# Patient Record
Sex: Male | Born: 1955
Health system: Southern US, Community
[De-identification: ages and names within clinical notes are randomized; demographics above are authoritative.]

## PROBLEM LIST (undated history)

## (undated) DIAGNOSIS — R05 Cough: Secondary | ICD-10-CM

## (undated) DIAGNOSIS — M545 Low back pain, unspecified: Secondary | ICD-10-CM

## (undated) DIAGNOSIS — R058 Other specified cough: Secondary | ICD-10-CM

## (undated) DIAGNOSIS — M109 Gout, unspecified: Secondary | ICD-10-CM

## (undated) DIAGNOSIS — I10 Essential (primary) hypertension: Secondary | ICD-10-CM

## (undated) DIAGNOSIS — T464X5A Adverse effect of angiotensin-converting-enzyme inhibitors, initial encounter: Secondary | ICD-10-CM

## (undated) DIAGNOSIS — S8290XA Unspecified fracture of unspecified lower leg, initial encounter for closed fracture: Secondary | ICD-10-CM

## (undated) HISTORY — DX: Adverse effect of angiotensin-converting-enzyme inhibitors, initial encounter: T46.4X5A

## (undated) HISTORY — PX: LEG SURGERY: SHX1003

## (undated) HISTORY — DX: Low back pain, unspecified: M54.50

## (undated) HISTORY — DX: Low back pain: M54.5

## (undated) HISTORY — DX: Gout, unspecified: M10.9

## (undated) HISTORY — DX: Cough: R05

## (undated) HISTORY — DX: Unspecified fracture of unspecified lower leg, initial encounter for closed fracture: S82.90XA

## (undated) HISTORY — DX: Other specified cough: R05.8

## (undated) HISTORY — DX: Essential (primary) hypertension: I10

---

## 1986-10-08 DIAGNOSIS — S8290XA Unspecified fracture of unspecified lower leg, initial encounter for closed fracture: Secondary | ICD-10-CM

## 1986-10-08 HISTORY — DX: Unspecified fracture of unspecified lower leg, initial encounter for closed fracture: S82.90XA

## 2002-03-17 ENCOUNTER — Encounter: Payer: Self-pay | Admitting: Emergency Medicine

## 2002-03-17 ENCOUNTER — Emergency Department (HOSPITAL_COMMUNITY): Admission: EM | Admit: 2002-03-17 | Discharge: 2002-03-17 | Payer: Self-pay | Admitting: Emergency Medicine

## 2005-02-21 ENCOUNTER — Emergency Department (HOSPITAL_COMMUNITY): Admission: EM | Admit: 2005-02-21 | Discharge: 2005-02-21 | Payer: Self-pay | Admitting: Family Medicine

## 2005-03-07 ENCOUNTER — Emergency Department (HOSPITAL_COMMUNITY): Admission: EM | Admit: 2005-03-07 | Discharge: 2005-03-07 | Payer: Self-pay | Admitting: Family Medicine

## 2006-01-10 ENCOUNTER — Ambulatory Visit: Payer: Self-pay | Admitting: Internal Medicine

## 2006-01-16 ENCOUNTER — Ambulatory Visit (HOSPITAL_COMMUNITY): Admission: RE | Admit: 2006-01-16 | Discharge: 2006-01-16 | Payer: Self-pay | Admitting: Internal Medicine

## 2006-01-16 ENCOUNTER — Ambulatory Visit: Payer: Self-pay | Admitting: Internal Medicine

## 2006-02-06 ENCOUNTER — Ambulatory Visit: Payer: Self-pay | Admitting: Internal Medicine

## 2007-02-04 ENCOUNTER — Ambulatory Visit: Payer: Self-pay | Admitting: Internal Medicine

## 2007-02-04 LAB — CONVERTED CEMR LAB
ALT: 20 units/L (ref 0–40)
AST: 22 units/L (ref 0–37)
Albumin: 4 g/dL (ref 3.5–5.2)
Alkaline Phosphatase: 64 units/L (ref 39–117)
BUN: 8 mg/dL (ref 6–23)
Bilirubin, Direct: 0.2 mg/dL (ref 0.0–0.3)
CO2: 28 meq/L (ref 19–32)
Calcium: 9.3 mg/dL (ref 8.4–10.5)
Chloride: 106 meq/L (ref 96–112)
Cholesterol: 131 mg/dL (ref 0–200)
Creatinine, Ser: 1.1 mg/dL (ref 0.4–1.5)
GFR calc Af Amer: 91 mL/min
GFR calc non Af Amer: 75 mL/min
Glucose, Bld: 82 mg/dL (ref 70–99)
HDL: 34.2 mg/dL — ABNORMAL LOW (ref >39.0)
LDL Cholesterol: 67 mg/dL (ref 0–99)
PSA: 0.25 ng/mL (ref 0.10–4.00)
Potassium: 4 meq/L (ref 3.5–5.1)
Sodium: 140 meq/L (ref 135–145)
Testosterone: 390.36 ng/dL (ref 350.00–890)
Total Bilirubin: 0.9 mg/dL (ref 0.3–1.2)
Total CHOL/HDL Ratio: 3.8
Total Protein: 7.4 g/dL (ref 6.0–8.3)
Triglycerides: 149 mg/dL (ref 0–149)
VLDL: 30 mg/dL (ref 0–40)

## 2007-02-07 ENCOUNTER — Ambulatory Visit: Payer: Self-pay | Admitting: Internal Medicine

## 2007-03-14 ENCOUNTER — Ambulatory Visit: Payer: Self-pay | Admitting: Internal Medicine

## 2007-04-18 ENCOUNTER — Ambulatory Visit: Payer: Self-pay | Admitting: Internal Medicine

## 2007-04-25 ENCOUNTER — Ambulatory Visit: Payer: Self-pay | Admitting: Internal Medicine

## 2007-04-25 LAB — CONVERTED CEMR LAB
BUN: 8 mg/dL (ref 6–23)
CO2: 32 meq/L (ref 19–32)
Calcium: 9.5 mg/dL (ref 8.4–10.5)
Chloride: 105 meq/L (ref 96–112)
Creatinine, Ser: 1.1 mg/dL (ref 0.4–1.5)
GFR calc Af Amer: 91 mL/min
GFR calc non Af Amer: 75 mL/min
Glucose, Bld: 94 mg/dL (ref 70–99)
Potassium: 4.5 meq/L (ref 3.5–5.1)
Sodium: 143 meq/L (ref 135–145)

## 2007-05-02 ENCOUNTER — Ambulatory Visit: Payer: Self-pay | Admitting: Internal Medicine

## 2007-11-07 ENCOUNTER — Ambulatory Visit: Payer: Self-pay | Admitting: Internal Medicine

## 2007-11-07 DIAGNOSIS — J312 Chronic pharyngitis: Secondary | ICD-10-CM | POA: Insufficient documentation

## 2007-11-07 DIAGNOSIS — M545 Low back pain, unspecified: Secondary | ICD-10-CM | POA: Insufficient documentation

## 2007-11-07 DIAGNOSIS — E669 Obesity, unspecified: Secondary | ICD-10-CM | POA: Insufficient documentation

## 2007-11-07 DIAGNOSIS — E785 Hyperlipidemia, unspecified: Secondary | ICD-10-CM | POA: Insufficient documentation

## 2007-11-07 DIAGNOSIS — I10 Essential (primary) hypertension: Secondary | ICD-10-CM | POA: Insufficient documentation

## 2007-11-07 DIAGNOSIS — F528 Other sexual dysfunction not due to a substance or known physiological condition: Secondary | ICD-10-CM | POA: Insufficient documentation

## 2008-04-05 ENCOUNTER — Ambulatory Visit: Payer: Self-pay | Admitting: Internal Medicine

## 2008-04-05 DIAGNOSIS — M542 Cervicalgia: Secondary | ICD-10-CM | POA: Insufficient documentation

## 2008-07-14 ENCOUNTER — Ambulatory Visit: Payer: Self-pay | Admitting: Internal Medicine

## 2008-07-14 LAB — CONVERTED CEMR LAB
ALT: 18 units/L (ref 0–53)
AST: 22 units/L (ref 0–37)
CRP, High Sensitivity: <1 — ABNORMAL LOW (ref 0.00–5.00)
Cholesterol: 125 mg/dL (ref 0–200)
HDL: 33.5 mg/dL — ABNORMAL LOW (ref >39.0)
LDL Cholesterol: 64 mg/dL (ref 0–99)
PSA: 0.26 ng/mL (ref 0.10–4.00)
TSH: 1.04 microintl units/mL (ref 0.35–5.50)
Total CHOL/HDL Ratio: 3.7
Triglycerides: 139 mg/dL (ref 0–149)
VLDL: 28 mg/dL (ref 0–40)

## 2008-08-23 ENCOUNTER — Ambulatory Visit: Payer: Self-pay | Admitting: Internal Medicine

## 2008-10-25 ENCOUNTER — Telehealth: Payer: Self-pay | Admitting: Internal Medicine

## 2009-04-04 ENCOUNTER — Telehealth: Payer: Self-pay | Admitting: Internal Medicine

## 2009-06-14 ENCOUNTER — Ambulatory Visit: Payer: Self-pay | Admitting: Internal Medicine

## 2009-06-14 DIAGNOSIS — K644 Residual hemorrhoidal skin tags: Secondary | ICD-10-CM | POA: Insufficient documentation

## 2009-06-23 ENCOUNTER — Telehealth: Payer: Self-pay | Admitting: Internal Medicine

## 2010-01-18 ENCOUNTER — Telehealth: Payer: Self-pay | Admitting: Internal Medicine

## 2010-01-19 ENCOUNTER — Telehealth: Payer: Self-pay | Admitting: Internal Medicine

## 2010-05-10 ENCOUNTER — Telehealth: Payer: Self-pay | Admitting: Internal Medicine

## 2010-06-02 ENCOUNTER — Ambulatory Visit: Payer: Self-pay | Admitting: Internal Medicine

## 2010-06-05 ENCOUNTER — Encounter: Payer: Self-pay | Admitting: Internal Medicine

## 2010-07-07 ENCOUNTER — Encounter: Payer: Self-pay | Admitting: Internal Medicine

## 2010-07-07 LAB — CONVERTED CEMR LAB
ALT: 19 units/L (ref 0–53)
AST: 16 units/L (ref 0–37)
Albumin: 4.4 g/dL (ref 3.5–5.2)
Alkaline Phosphatase: 76 units/L (ref 39–117)
BUN: 10 mg/dL (ref 6–23)
Bilirubin, Direct: 0.1 mg/dL (ref 0.0–0.3)
CO2: 26 meq/L (ref 19–32)
Calcium: 9.4 mg/dL (ref 8.4–10.5)
Chloride: 105 meq/L (ref 96–112)
Cholesterol: 132 mg/dL (ref 0–200)
Creatinine, Ser: 1.17 mg/dL (ref 0.40–1.50)
Glucose, Bld: 77 mg/dL (ref 70–99)
HDL: 37 mg/dL — ABNORMAL LOW (ref >39)
Indirect Bilirubin: 0.4 mg/dL (ref 0.0–0.9)
LDL Cholesterol: 57 mg/dL (ref 0–99)
Potassium: 4.7 meq/L (ref 3.5–5.3)
Sodium: 141 meq/L (ref 135–145)
TSH: 1.534 microintl units/mL (ref 0.350–4.500)
Total Bilirubin: 0.5 mg/dL (ref 0.3–1.2)
Total CHOL/HDL Ratio: 3.6
Total Protein: 7.3 g/dL (ref 6.0–8.3)
Triglycerides: 192 mg/dL — ABNORMAL HIGH (ref <150)
VLDL: 38 mg/dL (ref 0–40)

## 2010-08-03 ENCOUNTER — Telehealth: Payer: Self-pay | Admitting: Internal Medicine

## 2010-08-03 ENCOUNTER — Encounter: Payer: Self-pay | Admitting: Internal Medicine

## 2010-11-07 NOTE — Progress Notes (Signed)
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 Initial call taken by: Lannette Donath,  May 10, 2010 1:03 PM  Follow-up for Phone Call        ok to schedule appt Follow-up by: D. Thomos Lemons DO,  May 15, 2010 9:43 AM  Additional Follow-up for Phone Call Additional follow up Details #1::        Pt has been scheduled for 05/24/10 Additional Follow-up by: Lannette Donath,  May 15, 2010 11:31 AM

## 2010-11-07 NOTE — Assessment & Plan Note (Signed)
Summary: cpx/mhf   Vital Signs:  Patient profile:   55 year old male Height:      72 inches Weight:      226.50 pounds BMI:     30.83 O2 Sat:      99 % on Room air Temp:     97.8 degrees F oral Pulse rate:   68 / minute Pulse rhythm:   regular Resp:     18 per minute BP sitting:   140 / 80  (left arm) Cuff size:   large  Vitals Entered By: Glendell Docker CMA (June 02, 2010 11:26 AM)  O2 Flow:  Room air CC: CPX Is Patient Diabetic? No Pain Assessment Patient in pain? no      Comments physical for foster care, non fasting    Primary Care Provider:  Dondra Spry DO  CC:  CPX.  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 Ace Inhibitors  Past History:  Past Medical History: Right leg fracture 1988 Hypertension ACE cough   Low Back Pain    Family History: Mother has hypertension      Social History: Occupation:  Naval architect  Museum/gallery conservator) Married  3 grown children Never Smoked Alcohol use-no Drug use-no     Smoking Status:  quit Caffeine use/day:  NONE Does Patient Exercise:  yes  Review of Systems  The patient denies anorexia, fever, weight loss, weight gain, chest pain, syncope, dyspnea on exertion, prolonged cough, abdominal pain, melena, hematochezia, severe indigestion/heartburn, and depression.    Physical Exam  General:  alert, well-developed, and well-nourished.   Lungs:  normal respiratory effort and normal breath sounds.   Heart:  normal rate, regular rhythm, and no gallop.     Abdomen:  soft, non-tender, normal bowel sounds, no masses, no rigidity, no hepatomegaly, and no splenomegaly.   Extremities:  No lower extremity edema  Neurologic:  cranial nerves II-XII intact and gait normal.   Psych:  normally interactive, good eye contact, not anxious appearing, and not depressed appearing.     Impression & Recommendations:  Problem # 1:  PREVENTIVE HEALTH CARE (ICD-V70.0) Reviewed adult health maintenance protocols. Pt counseled on diet and exercise. no medical issues that would prevent pt from acting as foster parent  Colonoscopy: Normal (05/02/2007) Td Booster: Tdap (06/02/2010)   Flu Vax: given (08/25/2007)   Chol: 125 (07/14/2008)   HDL: 33.5 (07/14/2008)   LDL: 64 (07/14/2008)   TG: 139 (07/14/2008) TSH: 1.04 (07/14/2008)   PSA: 0.26 (07/14/2008)  Problem # 2:  HYPERTENSION (ICD-401.9) azor changed to generic regimen.  reassess in 2 months His updated medication list for this problem includes:    Amlodipine Besylate 10 Mg Tabs (Amlodipine besylate) ..... One by mouth once daily    Losartan Potassium 50 Mg Tabs (Losartan potassium) ..... One by mouth once daily  Future Orders: T-Basic Metabolic Panel [phone]) ... 06/14/2010 T-Hepatic Function [phone]) ... 06/14/2010 T-Lipid Profile [id number]) ... 06/14/2010 T-TSH [phone]) .Marland KitchenMarland Kitchen  06/14/2010  BP today: 140/80 Prior BP: 130/80 (06/14/2009)  Labs Reviewed: K+: 4.5 (04/25/2007) Creat: : 1.1 (04/25/2007)   Chol: 125 (07/14/2008)   HDL: 33.5 (07/14/2008)   LDL: 64 (07/14/2008)   TG: 139 (07/14/2008)  Complete Medication List: 1)  Amlodipine Besylate 10 Mg Tabs (Amlodipine besylate) .... One by mouth once daily 2)  Losartan Potassium 50 Mg Tabs (Losartan potassium) .... One by mouth once daily  Other Orders: TB Skin Test [id number]) Admin 1st Vaccine (00938) Tdap => 72yrs IM (18299) Admin of Any Addtl Vaccine (37169) EKG w/ Interpretation (93000)  Patient Instructions: 1)  Please  schedule a follow-up appointment in 2 months. 2)  BMP prior to visit, ICD-9:  401.9 3)  Hepatic Panel prior to visit, ICD-9: 401.9 4)  Lipid Panel prior to visit, ICD-9:  401.9 5)  TSH prior to visit, ICD-9: 401.9 6)  Please return for lab work within 2 weeks (fasting) Prescriptions: AMLODIPINE BESYLATE 10 MG  TABS (AMLODIPINE BESYLATE) one by mouth once daily  #30 x 3   Entered and Authorized by:   D. Thomos Lemons DO   Signed by:   D. Thomos Lemons DO on 06/02/2010   Method used:   Electronically to        [facility] [id number] (retail)       [address] [place], [place]  [address]       Ph: [phone]       Fax: [phone]   RxID:   [id number] LOSARTAN POTASSIUM 50 MG TABS (LOSARTAN POTASSIUM) one by mouth once daily  #30 x 3   Entered and Authorized by:   D. Thomos Lemons DO   Signed by:   D. Thomos Lemons DO on 06/02/2010   Method used:   Electronically to        [facility] [id number] (retail)       [address] [place], [place]  [address]       Ph: [phone]       Fax: [phone]   RxID:   [id number]    Immunizations Administered:  PPD Skin Test:    Vaccine Type: PPD    Site: left forearm    Mfr: Sanofi Pasteur    Dose: 0.1 ml    Route: ID    Given by: Glendell Docker CMA    Exp. Date: 08/10/2011    Lot #: [id number]  Tetanus Vaccine:    Vaccine Type: Tdap    Site: left deltoid    Mfr: GlaxoSmithKline    Dose: 0.5 ml    Route: IM    Given by: Glendell Docker CMA    Exp. Date: 04/06/2012    Lot #: [id number]    VIS given: 08/26/07 version given June 02, 2010.

## 2010-11-07 NOTE — Progress Notes (Signed)
Summary: Hip much better  Phone Note Call from Patient Call back at [facility] Phone [phone]   Caller: Patient Summary of Call: Pt called back, stated his hip feels better and he does not feel he needs OV at this time, he will CB in a couple of weeks to make an appt to follow up. Initial call taken by: Lannette Donath,  January 19, 2010 8:50 AM  Follow-up for Phone Call        noted Follow-up by: D. Thomos Lemons DO,  January 19, 2010 1:27 PM

## 2010-11-07 NOTE — Assessment & Plan Note (Signed)
Summary: TB SKIN TEST RECHECK  Nurse Visit   Primary Care Provider:  Dondra Spry DO  CC:  TB Skin Test Recheck.  History of Present Illness: The patient presented after 48 hours to check the site for positive or negative reaction.  Injection site examination: No firm bump forms at the test site. Slightly reddish appearance and diameter was smaller than 5mm.   Assesment & Plan Negative TB skin test. Patient was counseled  to call if experience any irritation of site.  Glendell Docker CMA  June 05, 2010 12:30 PM   CC: TB Skin Test Recheck   Allergies: 1)  ! Ace Inhibitors

## 2010-11-07 NOTE — Letter (Signed)
   [place] [facility] [address] [place], [place]  [address]  [place] Phone: [phone]      August 03, 2010   [name] Frank [address] [place], [place] [address]  RE:  LAB RESULTS  Dear  [name],  The following is an interpretation of your most recent lab tests.  Please take note of any instructions provided or changes to medications that have resulted from your lab work.  ELECTROLYTES:  Good - no changes needed  KIDNEY FUNCTION TESTS:  Good - no changes needed  LIVER FUNCTION TESTS:  Good - no changes needed  LIPID PANEL:  Fair - review at your next visit Triglyceride: 192   Cholesterol: 132   LDL: 57   HDL: 37   Chol/HDL%:  3.6 Ratio  THYROID STUDIES:  Thyroid studies normal TSH: 1.534      Please see handout on low choleterol diet.       Sincerely Yours,    Dr. Thomos Lemons  Appended Document:  mailed

## 2010-11-07 NOTE — Progress Notes (Signed)
Summary: pain in hip wants referral   Phone Note Call from Patient Call back at [phone]   Caller: Patient Call For: D. Thomos Lemons DO Summary of Call: his left leg near his hip is stiff and sore and wondered if Dr Artist Pais could refer him to someone for that  Initial call taken by: Roselle Locus,  January 18, 2010 7:55 AM  Follow-up for Phone Call        patient called back and left voice message stating he had sudden onset of left side hip pain that is extremly sore. His message states that he is unable to bend his leg or walk with difficulty. He denies swelling of the site, but states that if the area is pressed it is sore to the touch. He has taken off work today and would like to know what Dr Artist Pais suggest he does for relief Follow-up by: Glendell Docker CMA,  January 18, 2010 10:42 AM  Additional Follow-up for Phone Call Additional follow up Details #1::        i suggest ov with [facility] Additional Follow-up by: D. Thomos Lemons DO,  January 18, 2010 12:31 PM    Additional Follow-up for Phone Call Additional follow up Details #2::    attempted to contact patient at [phone], no answer, detailed voice message left informing patient office visit needed per Dr Artist Pais instructions Follow-up by: Glendell Docker CMA,  January 18, 2010 2:01 PM

## 2010-11-07 NOTE — Progress Notes (Signed)
Summary: Lab Results  Phone Note Call from Patient Call back at Home Phone [phone]   Caller: Patient Call For: D. Thomos Lemons DO Summary of Call: patient called and left voice message requesting his blood work. He states that he is scheduled for follow up with Dr Artist Pais on 10/28 and if he does not to come in, he would rather get his lab results over the phone Initial call taken by: Glendell Docker CMA,  August 03, 2010 11:52 AM  Follow-up for Phone Call        lab results are normal.  I will send letter I suggest nurse visit for BP  Follow-up by: D. Thomos Lemons DO,  August 03, 2010 12:16 PM  Additional Follow-up for Phone Call Additional follow up Details #1::        call returned to patient at [phone], he has been advised per Dr Artist Pais instructions. He states that he will call back to schedule Additional Follow-up by: Glendell Docker CMA,  August 03, 2010 1:24 PM

## 2010-11-07 NOTE — Letter (Signed)
Summary: Medical Eval Form/[place] [employer]  Medical Eval Form/[place] [employer]   Imported By: Lanelle Bal 06/13/2010 14:12:02  _____________________________________________________________________  External Attachment:    Type:   Image     Comment:   External Document

## 2011-04-08 ENCOUNTER — Other Ambulatory Visit: Payer: Self-pay | Admitting: Internal Medicine

## 2011-04-09 NOTE — Telephone Encounter (Signed)
Rx refill denied patient will need office visit.

## 2011-04-13 ENCOUNTER — Encounter: Payer: Self-pay | Admitting: Internal Medicine

## 2011-04-16 ENCOUNTER — Encounter: Payer: Self-pay | Admitting: Family Medicine

## 2011-04-16 ENCOUNTER — Ambulatory Visit (INDEPENDENT_AMBULATORY_CARE_PROVIDER_SITE_OTHER): Payer: BC Managed Care – PPO | Admitting: Family Medicine

## 2011-04-16 VITALS — BP 140/94 | HR 98 | Ht 72.0 in | Wt 233.0 lb

## 2011-04-16 DIAGNOSIS — I10 Essential (primary) hypertension: Secondary | ICD-10-CM

## 2011-04-16 DIAGNOSIS — E669 Obesity, unspecified: Secondary | ICD-10-CM

## 2011-04-16 MED ORDER — AMLODIPINE BESYLATE 10 MG PO TABS: 10.0000 mg | ORAL_TABLET | Freq: Every day | ORAL | Status: DC

## 2011-04-16 NOTE — Patient Instructions (Addendum)

## 2011-04-24 ENCOUNTER — Encounter: Payer: Self-pay | Admitting: Family Medicine

## 2011-04-24 NOTE — Progress Notes (Signed)
[name] Frank [id number] 1956/05/11 04/24/2011      Progress Note-Follow Up  Subjective  Chief Complaint  Chief Complaint  Patient presents with  . Hypertension    refill Amlodipine, pt has not had meds x 2 weeks, pt states he has been calling office for refill, but no one is returning his calls    HPI  Patient is in today for refills on his Amlodipine. He is out of the medicine presently and can tell his pressure is up a little. He denies any CP/palp/SOB/edema/recent illness/fevers/chills/GI or GU c/o. 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 Noi acute complaints today  Past Medical History  Diagnosis Date  . Fracture of leg 1988    right leg  . Hypertension   . ACE-inhibitor cough   . Low back pain     History reviewed. No pertinent past surgical history.  Family History  Problem Relation Age of Onset  . Hypertension Mother     History   Social History  . Marital Status: Married    Spouse Name: N/A    Number of Children: N/A  . Years of Education: N/A   Occupational History  . Not on file.   Social History Main Topics  . Smoking status: Never Smoker   . Smokeless tobacco: Never Used  . Alcohol Use: No  . Drug Use: No  . Sexually Active: Not on file   Other Topics Concern  . Not on file   Social History Narrative   Occupation:  Naval architect  Radiographer, therapeutic Papers)Married  3 grown childrenNever SmokedAlcohol use-noDrug use-no    Smoking Status:  quitCaffeine use/day:  NONEDoes Patient Exercise:  yes    No current outpatient prescriptions on file prior to visit.    Allergies  Allergen Reactions  . Ace Inhibitors     REACTION: Cough  . Losartan     Throat irritation    Review of Systems  Review of Systems  Constitutional: Negative for fever and malaise/fatigue.  HENT: Negative for congestion.   Eyes: Negative for discharge.  Respiratory: Negative for shortness of breath.   Cardiovascular: Negative for chest  pain, palpitations and leg swelling.  Gastrointestinal: Negative for nausea, abdominal pain and diarrhea.  Genitourinary: Negative for dysuria.  Musculoskeletal: Negative for falls.  Skin: Negative for rash.  Neurological: Negative for loss of consciousness and headaches.  Endo/Heme/Allergies: Negative for polydipsia.  Psychiatric/Behavioral: Negative for depression and suicidal ideas. The patient is not nervous/anxious and does not have insomnia.     Objective  BP 140/94  Pulse 98  Ht 6' (1.829 m)  Wt 233 lb (105.688 kg)  BMI 31.60 kg/m2  SpO2 98%  Physical Exam  Physical Exam  Constitutional: He is oriented to person, place, and time and well-developed, well-nourished, and in no distress. No distress.  HENT:  Head: Normocephalic and atraumatic.  Eyes: Conjunctivae are normal.  Neck: Neck supple. No thyromegaly present.  Cardiovascular: Normal rate, regular rhythm and normal heart sounds.   No murmur heard. Pulmonary/Chest: Effort normal and breath sounds normal. No respiratory distress.  Abdominal: He exhibits no distension and no mass. There is no tenderness.  Musculoskeletal: He exhibits no edema.  Neurological: He is alert and oriented to person, place, and time.  Skin: Skin is warm.  Psychiatric: Memory, affect and judgment normal.    Lab Results  Component Value Date   TSH 1.534 07/07/2010   No results found for this basename: WBC,  HGB, HCT, MCV, PLT   Lab Results  Component Value Date   CREATININE 1.17 07/07/2010   BUN 10 07/07/2010   NA 141 07/07/2010   K 4.7 07/07/2010   CL 105 07/07/2010   CO2 26 07/07/2010   Lab Results  Component Value Date   ALT 19 07/07/2010   AST 16 07/07/2010   ALKPHOS 76 07/07/2010   BILITOT 0.5 07/07/2010   Lab Results  Component Value Date   CHOL 132 07/07/2010   Lab Results  Component Value Date   HDL 37* 07/07/2010   Lab Results  Component Value Date   LDLCALC 57 07/07/2010   Lab Results  Component Value Date   TRIG  192* 07/07/2010   Lab Results  Component Value Date   CHOLHDL 3.6 Ratio 07/07/2010     Assessment & Plan  HYPERTENSION Patient experienced tightness and irritation in throat while he was taking Losartan so he stopped and he reports he has been seeing good BP numbers just on his Amlodipine alone and he has felt well. He denies any edema, SOB or concerns, he is given refillls on his Amlodipine today  OBESITY, UNSPECIFIED Encouraged small, frequent meals, with lean proteins, complex carbs and consider adding fish oil and fiber supplements

## 2011-04-24 NOTE — Assessment & Plan Note (Addendum)
Patient experienced tightness and irritation in throat while he was taking Losartan so he stopped and he reports he has been seeing good BP numbers just on his Amlodipine alone and he has felt well. He denies any edema, SOB or concerns, he is given refillls on his Amlodipine today

## 2011-04-24 NOTE — Assessment & Plan Note (Signed)
Encouraged small, frequent meals, with lean proteins, complex carbs and consider adding fish oil and fiber supplements

## 2011-07-16 ENCOUNTER — Ambulatory Visit: Payer: BC Managed Care – PPO | Admitting: Family

## 2012-03-24 ENCOUNTER — Encounter: Payer: Self-pay | Admitting: Internal Medicine

## 2012-03-24 ENCOUNTER — Ambulatory Visit (INDEPENDENT_AMBULATORY_CARE_PROVIDER_SITE_OTHER): Payer: BC Managed Care – PPO | Admitting: Internal Medicine

## 2012-03-24 VITALS — BP 112/70 | HR 76 | Temp 97.6°F | Resp 18 | Ht 72.0 in | Wt 222.0 lb

## 2012-03-24 DIAGNOSIS — E785 Hyperlipidemia, unspecified: Secondary | ICD-10-CM

## 2012-03-24 DIAGNOSIS — I10 Essential (primary) hypertension: Secondary | ICD-10-CM

## 2012-03-24 NOTE — Assessment & Plan Note (Signed)
Normotensive off medication. Continue dietary modifications and regular exercise. Encouraged weight loss and monitoring of BP

## 2012-03-24 NOTE — Assessment & Plan Note (Signed)
Low fat diet and regular exercise. Discussed f/u lipid profile and will schedule cpe

## 2012-03-24 NOTE — Patient Instructions (Addendum)
Please schedule a physical at your convenience You can do your fasting labs before the visit  (cbc, chem7, lipid, lft, tsh, psa, ua with reflex micro-v70.0)

## 2012-03-24 NOTE — Progress Notes (Signed)
  Subjective:    Patient ID: [name] Frank, male    DOB: 1956-06-23, 56 y.o.   MRN: [id number]  HPI Pt presents to clinic for follow up of HTN and for form completion. Previously took norvasc for HTN but was able to dc medication after diet change and regular exercise. BP reviewed normotensive today and was apparently nl during recent DOT physical. Reviewed past mild hyperlipidemia with low hdl and mildly high TG. Not taking medication for the problem. Has foster care form to be completed. No known psychiatric or infectious condition.   Past Medical History  Diagnosis Date  . Fracture of leg 1988    right leg  . Hypertension   . ACE-inhibitor cough   . Low back pain    No past surgical history on file.  reports that he has never smoked. He has never used smokeless tobacco. He reports that he does not drink alcohol or use illicit drugs. family history includes Hypertension in his mother. Allergies  Allergen Reactions  . Ace Inhibitors     REACTION: Cough  . Losartan     Throat irritation      Review of Systems see hpi     Objective:   Physical Exam  Nursing note and vitals reviewed. Constitutional: He appears well-developed and well-nourished. No distress.  HENT:  Head: Normocephalic.  Right Ear: External ear normal.  Left Ear: External ear normal.  Eyes: Conjunctivae are normal. No scleral icterus.  Neck: Neck supple. Carotid bruit is not present.  Cardiovascular: Normal rate, regular rhythm and normal heart sounds.  Exam reveals no gallop and no friction rub.   No murmur heard. Pulmonary/Chest: Effort normal and breath sounds normal. No respiratory distress. He has no wheezes. He has no rales.  Neurological: He is alert.  Skin: Skin is warm and dry. He is not diaphoretic.  Psychiatric: He has a normal mood and affect.          Assessment & Plan:

## 2012-12-15 ENCOUNTER — Encounter (HOSPITAL_COMMUNITY): Payer: Self-pay | Admitting: Pharmacy Technician

## 2012-12-15 ENCOUNTER — Other Ambulatory Visit (HOSPITAL_COMMUNITY): Payer: Self-pay | Admitting: Orthopaedic Surgery

## 2012-12-16 ENCOUNTER — Encounter (HOSPITAL_COMMUNITY)
Admission: RE | Admit: 2012-12-16 | Discharge: 2012-12-16 | Disposition: A | Discharge status: Home / Self Care | Payer: BC Managed Care – PPO | Source: Ambulatory Visit | Attending: Orthopaedic Surgery | Admitting: Orthopaedic Surgery

## 2012-12-16 ENCOUNTER — Encounter (HOSPITAL_COMMUNITY): Payer: Self-pay

## 2012-12-16 LAB — CBC
MCH: 28.4 pg (ref 26.0–34.0)
MCHC: 34.9 g/dL (ref 30.0–36.0)
Platelets: 177 10*3/uL (ref 150–400)
RDW: 13.5 % (ref 11.5–15.5)

## 2012-12-16 LAB — COMPREHENSIVE METABOLIC PANEL WITH GFR
ALT: 18 U/L (ref 0–53)
AST: 19 U/L (ref 0–37)
Albumin: 4 g/dL (ref 3.5–5.2)
Alkaline Phosphatase: 85 U/L (ref 39–117)
BUN: 11 mg/dL (ref 6–23)
CO2: 24 meq/L (ref 19–32)
Calcium: 9.5 mg/dL (ref 8.4–10.5)
Chloride: 104 meq/L (ref 96–112)
Creatinine, Ser: 1.14 mg/dL (ref 0.50–1.35)
GFR calc Af Amer: 81 mL/min — ABNORMAL LOW
GFR calc non Af Amer: 70 mL/min — ABNORMAL LOW
Glucose, Bld: 95 mg/dL (ref 70–99)
Potassium: 4.2 meq/L (ref 3.5–5.1)
Sodium: 139 meq/L (ref 135–145)
Total Bilirubin: 0.7 mg/dL (ref 0.3–1.2)
Total Protein: 8.2 g/dL (ref 6.0–8.3)

## 2012-12-16 LAB — SURGICAL PCR SCREEN
MRSA, PCR: NEGATIVE
Staphylococcus aureus: NEGATIVE

## 2012-12-16 MED ORDER — CEFAZOLIN SODIUM-DEXTROSE 2-3 GM-% IV SOLR
2.0000 g | INTRAVENOUS | Status: AC
  Administered 2012-12-17: 2 g via INTRAVENOUS
  Filled 2012-12-16: qty 50

## 2012-12-16 NOTE — Progress Notes (Signed)
This pt has screened at an elevated risk for obstructive sleep apnea using the STOP bang tool during a pre surgical visit. 

## 2012-12-16 NOTE — Pre-Procedure Instructions (Addendum)
[name] Frank  12/16/2012   Your procedure is scheduled on:  December 17, 2012  Report to [facility] at12:55 AM.  Call this number if you have problems the morning of surgery: [phone]   Remember:   Do not eat food or drink liquids after midnight.   Take these medicines the morning of surgery with A SIP OF WATER:   Do not wear jewelry, make-up or nail polish.  Do not wear lotions, powders, or perfumes. You may wear deodorant.  Do not shave 48 hours prior to surgery. Men may shave face and neck.  Do not bring valuables to the hospital.  Contacts, dentures or bridgework may not be worn into surgery.  Leave suitcase in the car. After surgery it may be brought to your room.  For patients admitted to the hospital, checkout time is 11:00 AM the day of  discharge.   Patients discharged the day of surgery will not be allowed to drive  home.  Name and phone number of your driver: FAMILY/FRIEND  Special Instructions: Shower using CHG 2 nights before surgery and the night before surgery.  If you shower the day of surgery use CHG.  Use special wash - you have one bottle of CHG for all showers.  You should use approximately 1/3 of the bottle for each shower.   Please read over the following fact sheets that you were given: Pain Booklet, Coughing and Deep Breathing and Surgical Site Infection Prevention

## 2012-12-17 ENCOUNTER — Encounter (HOSPITAL_COMMUNITY)
Admission: RE | Disposition: A | Discharge status: Home / Self Care | Payer: Self-pay | Source: Ambulatory Visit | Attending: Orthopaedic Surgery

## 2012-12-17 ENCOUNTER — Observation Stay (HOSPITAL_COMMUNITY)
Admission: RE | Admit: 2012-12-17 | Discharge: 2012-12-18 | Disposition: A | Discharge status: Home / Self Care | Payer: BC Managed Care – PPO | Source: Ambulatory Visit | Attending: Orthopaedic Surgery | Admitting: Orthopaedic Surgery

## 2012-12-17 ENCOUNTER — Ambulatory Visit (HOSPITAL_COMMUNITY): Payer: BC Managed Care – PPO | Admitting: Vascular Surgery

## 2012-12-17 ENCOUNTER — Encounter (HOSPITAL_COMMUNITY): Payer: Self-pay | Admitting: Vascular Surgery

## 2012-12-17 ENCOUNTER — Encounter (HOSPITAL_COMMUNITY): Payer: Self-pay | Admitting: *Deleted

## 2012-12-17 DIAGNOSIS — W010XXA Fall on same level from slipping, tripping and stumbling without subsequent striking against object, initial encounter: Secondary | ICD-10-CM | POA: Insufficient documentation

## 2012-12-17 DIAGNOSIS — IMO0002 Reserved for concepts with insufficient information to code with codable children: Principal | ICD-10-CM | POA: Insufficient documentation

## 2012-12-17 DIAGNOSIS — S76119A Strain of unspecified quadriceps muscle, fascia and tendon, initial encounter: Secondary | ICD-10-CM

## 2012-12-17 DIAGNOSIS — I10 Essential (primary) hypertension: Secondary | ICD-10-CM | POA: Insufficient documentation

## 2012-12-17 DIAGNOSIS — S76112A Strain of left quadriceps muscle, fascia and tendon, initial encounter: Secondary | ICD-10-CM

## 2012-12-17 HISTORY — PX: QUADRICEPS TENDON REPAIR: SHX756

## 2012-12-17 SURGERY — REPAIR, TENDON, QUADRICEPS
Anesthesia: General | Site: Knee | Laterality: Left | Wound class: Clean

## 2012-12-17 MED ORDER — 0.9 % SODIUM CHLORIDE (POUR BTL) OPTIME
TOPICAL | Status: DC | PRN
  Administered 2012-12-17: 1000 mL

## 2012-12-17 MED ORDER — PROPOFOL 10 MG/ML IV BOLUS
INTRAVENOUS | Status: DC | PRN
  Administered 2012-12-17: 200 mg via INTRAVENOUS

## 2012-12-17 MED ORDER — ONDANSETRON HCL 4 MG/2ML IJ SOLN: 4.0000 mg | Freq: Once | INTRAMUSCULAR | Status: DC | PRN

## 2012-12-17 MED ORDER — MORPHINE SULFATE 2 MG/ML IJ SOLN
1.0000 mg | INTRAMUSCULAR | Status: DC | PRN
  Administered 2012-12-17: 1 mg via INTRAVENOUS
  Filled 2012-12-17: qty 1

## 2012-12-17 MED ORDER — HYDROMORPHONE HCL PF 1 MG/ML IJ SOLN
INTRAMUSCULAR | Status: DC | PRN
  Administered 2012-12-17 (×2): 0.5 mg via INTRAVENOUS

## 2012-12-17 MED ORDER — KCL IN DEXTROSE-NACL 20-5-0.45 MEQ/L-%-% IV SOLN
INTRAVENOUS | Status: DC
  Administered 2012-12-17: 21:00:00 via INTRAVENOUS
  Filled 2012-12-17 (×3): qty 1000

## 2012-12-17 MED ORDER — KETOROLAC TROMETHAMINE 30 MG/ML IJ SOLN
30.0000 mg | Freq: Four times a day (QID) | INTRAMUSCULAR | Status: AC
  Administered 2012-12-17 – 2012-12-18 (×4): 30 mg via INTRAVENOUS
  Filled 2012-12-17 (×3): qty 1

## 2012-12-17 MED ORDER — HYDROMORPHONE HCL PF 1 MG/ML IJ SOLN
0.2500 mg | INTRAMUSCULAR | Status: DC | PRN
  Administered 2012-12-17 (×4): 0.5 mg via INTRAVENOUS

## 2012-12-17 MED ORDER — SENNOSIDES-DOCUSATE SODIUM 8.6-50 MG PO TABS: 1.0000 | ORAL_TABLET | Freq: Every evening | ORAL | Status: DC | PRN

## 2012-12-17 MED ORDER — KETOROLAC TROMETHAMINE 30 MG/ML IJ SOLN
INTRAMUSCULAR | Status: AC
  Filled 2012-12-17: qty 1

## 2012-12-17 MED ORDER — HYDROCODONE-ACETAMINOPHEN 5-325 MG PO TABS
1.0000 | ORAL_TABLET | ORAL | Status: DC | PRN
  Administered 2012-12-18: 2 via ORAL
  Filled 2012-12-17: qty 2

## 2012-12-17 MED ORDER — METHOCARBAMOL 100 MG/ML IJ SOLN
500.0000 mg | Freq: Four times a day (QID) | INTRAVENOUS | Status: DC | PRN
  Filled 2012-12-17: qty 5

## 2012-12-17 MED ORDER — ACETAMINOPHEN 10 MG/ML IV SOLN
INTRAVENOUS | Status: AC
  Filled 2012-12-17: qty 100

## 2012-12-17 MED ORDER — OXYCODONE-ACETAMINOPHEN 5-325 MG PO TABS: 1.0000 | ORAL_TABLET | ORAL | Status: DC | PRN

## 2012-12-17 MED ORDER — LACTATED RINGERS IV SOLN
INTRAVENOUS | Status: DC | PRN
  Administered 2012-12-17: 15:00:00 via INTRAVENOUS

## 2012-12-17 MED ORDER — ONDANSETRON HCL 4 MG/2ML IJ SOLN: 4.0000 mg | Freq: Four times a day (QID) | INTRAMUSCULAR | Status: DC | PRN

## 2012-12-17 MED ORDER — LACTATED RINGERS IV SOLN
INTRAVENOUS | Status: DC
  Administered 2012-12-17: 15:00:00 via INTRAVENOUS

## 2012-12-17 MED ORDER — ONDANSETRON HCL 4 MG PO TABS: 4.0000 mg | ORAL_TABLET | Freq: Four times a day (QID) | ORAL | Status: DC | PRN

## 2012-12-17 MED ORDER — METOCLOPRAMIDE HCL 5 MG/ML IJ SOLN: 5.0000 mg | Freq: Three times a day (TID) | INTRAMUSCULAR | Status: DC | PRN

## 2012-12-17 MED ORDER — METHOCARBAMOL 500 MG PO TABS
ORAL_TABLET | ORAL | Status: AC
  Filled 2012-12-17: qty 1

## 2012-12-17 MED ORDER — HYDROMORPHONE HCL PF 1 MG/ML IJ SOLN
INTRAMUSCULAR | Status: AC
  Filled 2012-12-17: qty 1

## 2012-12-17 MED ORDER — ACETAMINOPHEN 10 MG/ML IV SOLN
1000.0000 mg | Freq: Once | INTRAVENOUS | Status: AC | PRN
  Administered 2012-12-17: 1000 mg via INTRAVENOUS

## 2012-12-17 MED ORDER — CEFAZOLIN SODIUM-DEXTROSE 2-3 GM-% IV SOLR
2.0000 g | Freq: Four times a day (QID) | INTRAVENOUS | Status: AC
  Administered 2012-12-17 – 2012-12-18 (×3): 2 g via INTRAVENOUS
  Filled 2012-12-17 (×3): qty 50

## 2012-12-17 MED ORDER — MIDAZOLAM HCL 5 MG/5ML IJ SOLN
INTRAMUSCULAR | Status: DC | PRN
  Administered 2012-12-17: 2 mg via INTRAVENOUS

## 2012-12-17 MED ORDER — BISACODYL 10 MG RE SUPP: 10.0000 mg | Freq: Every day | RECTAL | Status: DC | PRN

## 2012-12-17 MED ORDER — DOCUSATE SODIUM 100 MG PO CAPS
100.0000 mg | ORAL_CAPSULE | Freq: Two times a day (BID) | ORAL | Status: DC
  Administered 2012-12-17 – 2012-12-18 (×2): 100 mg via ORAL
  Filled 2012-12-17 (×2): qty 1

## 2012-12-17 MED ORDER — LIDOCAINE HCL (CARDIAC) 20 MG/ML IV SOLN
INTRAVENOUS | Status: DC | PRN
  Administered 2012-12-17: 40 mg via INTRAVENOUS

## 2012-12-17 MED ORDER — METHOCARBAMOL 500 MG PO TABS
500.0000 mg | ORAL_TABLET | Freq: Four times a day (QID) | ORAL | Status: DC | PRN
  Administered 2012-12-17: 500 mg via ORAL

## 2012-12-17 MED ORDER — DIPHENHYDRAMINE HCL 12.5 MG/5ML PO ELIX: 12.5000 mg | ORAL_SOLUTION | ORAL | Status: DC | PRN

## 2012-12-17 MED ORDER — FENTANYL CITRATE 0.05 MG/ML IJ SOLN
INTRAMUSCULAR | Status: DC | PRN
  Administered 2012-12-17 (×2): 50 ug via INTRAVENOUS
  Administered 2012-12-17: 150 ug via INTRAVENOUS
  Administered 2012-12-17: 50 ug via INTRAVENOUS

## 2012-12-17 MED ORDER — METOCLOPRAMIDE HCL 10 MG PO TABS: 5.0000 mg | ORAL_TABLET | Freq: Three times a day (TID) | ORAL | Status: DC | PRN

## 2012-12-17 MED ORDER — FLEET ENEMA 7-19 GM/118ML RE ENEM: 1.0000 | ENEMA | Freq: Once | RECTAL | Status: AC | PRN

## 2012-12-17 MED ORDER — BUPIVACAINE HCL 0.5 % IJ SOLN
INTRAMUSCULAR | Status: DC | PRN
  Administered 2012-12-17: 30 mL

## 2012-12-17 MED ORDER — MORPHINE SULFATE 2 MG/ML IJ SOLN
INTRAMUSCULAR | Status: AC
  Filled 2012-12-17: qty 1

## 2012-12-17 MED ORDER — ONDANSETRON HCL 4 MG/2ML IJ SOLN
INTRAMUSCULAR | Status: DC | PRN
  Administered 2012-12-17: 4 mg via INTRAVENOUS

## 2012-12-17 MED ORDER — ZOLPIDEM TARTRATE 5 MG PO TABS: 5.0000 mg | ORAL_TABLET | Freq: Every evening | ORAL | Status: DC | PRN

## 2012-12-17 MED ORDER — BUPIVACAINE HCL (PF) 0.5 % IJ SOLN
INTRAMUSCULAR | Status: AC
  Filled 2012-12-17: qty 30

## 2012-12-17 SURGICAL SUPPLY — 72 items
APL SKNCLS STERI-STRIP NONHPOA (GAUZE/BANDAGES/DRESSINGS) ×1
BANDAGE ELASTIC 4 VELCRO ST LF (GAUZE/BANDAGES/DRESSINGS) ×1 IMPLANT
BANDAGE ELASTIC 6 VELCRO ST LF (GAUZE/BANDAGES/DRESSINGS) ×2 IMPLANT
BANDAGE ESMARK 6X9 LF (GAUZE/BANDAGES/DRESSINGS) IMPLANT
BENZOIN TINCTURE PRP APPL 2/3 (GAUZE/BANDAGES/DRESSINGS) ×2 IMPLANT
BLADE SURG ROTATE 9660 (MISCELLANEOUS) ×1 IMPLANT
BNDG CMPR 9X6 STRL LF SNTH (GAUZE/BANDAGES/DRESSINGS) ×1
BNDG COHESIVE 6X5 TAN STRL LF (GAUZE/BANDAGES/DRESSINGS) ×1 IMPLANT
BNDG ESMARK 6X9 LF (GAUZE/BANDAGES/DRESSINGS) ×2
CLOTH BEACON ORANGE TIMEOUT ST (SAFETY) ×2 IMPLANT
COVER SURGICAL LIGHT HANDLE (MISCELLANEOUS) ×2 IMPLANT
CUFF TOURNIQUET SINGLE 34IN LL (TOURNIQUET CUFF) ×1 IMPLANT
CUFF TOURNIQUET SINGLE 44IN (TOURNIQUET CUFF) IMPLANT
DRSG ADAPTIC 3X8 NADH LF (GAUZE/BANDAGES/DRESSINGS) ×1 IMPLANT
DRSG EMULSION OIL 3X3 NADH (GAUZE/BANDAGES/DRESSINGS) ×1 IMPLANT
DRSG PAD ABDOMINAL 8X10 ST (GAUZE/BANDAGES/DRESSINGS) ×2 IMPLANT
ELECT REM PT RETURN 9FT ADLT (ELECTROSURGICAL) ×2
ELECTRODE REM PT RTRN 9FT ADLT (ELECTROSURGICAL) ×1 IMPLANT
GLOVE BIO SURGEON STRL SZ8.5 (GLOVE) ×1 IMPLANT
GLOVE BIOGEL PI IND STRL 6.5 (GLOVE) IMPLANT
GLOVE BIOGEL PI IND STRL 7.5 (GLOVE) ×1 IMPLANT
GLOVE BIOGEL PI IND STRL 8 (GLOVE) ×1 IMPLANT
GLOVE BIOGEL PI INDICATOR 6.5 (GLOVE) ×1
GLOVE BIOGEL PI INDICATOR 7.5 (GLOVE) ×1
GLOVE BIOGEL PI INDICATOR 8 (GLOVE) ×1
GLOVE ECLIPSE 7.0 STRL STRAW (GLOVE) ×2 IMPLANT
GLOVE ORTHO TXT STRL SZ7.5 (GLOVE) ×2 IMPLANT
GLOVE SURG SS PI 6.5 STRL IVOR (GLOVE) ×1 IMPLANT
GLOVE SURG SS PI 8.5 STRL IVOR (GLOVE) ×1
GLOVE SURG SS PI 8.5 STRL STRW (GLOVE) IMPLANT
GOWN BRE IMP SLV AUR LG STRL (GOWN DISPOSABLE) ×1 IMPLANT
GOWN PREVENTION PLUS LG XLONG (DISPOSABLE) IMPLANT
GOWN PREVENTION PLUS XLARGE (GOWN DISPOSABLE) ×3 IMPLANT
GOWN STRL REIN 2XL XLG LVL4 (GOWN DISPOSABLE) ×1 IMPLANT
IMMOBILIZER KNEE 24 THIGH 36 (MISCELLANEOUS) IMPLANT
IMMOBILIZER KNEE 24 UNIV (MISCELLANEOUS) ×2
KIT BASIN OR (CUSTOM PROCEDURE TRAY) ×2 IMPLANT
KIT ROOM TURNOVER OR (KITS) ×2 IMPLANT
MANIFOLD NEPTUNE II (INSTRUMENTS) ×2 IMPLANT
NEEDLE 22X1 1/2 (OR ONLY) (NEEDLE) ×1 IMPLANT
NS IRRIG 1000ML POUR BTL (IV SOLUTION) ×2 IMPLANT
PACK ORTHO EXTREMITY (CUSTOM PROCEDURE TRAY) ×2 IMPLANT
PAD ARMBOARD 7.5X6 YLW CONV (MISCELLANEOUS) ×3 IMPLANT
PAD CAST 4YDX4 CTTN HI CHSV (CAST SUPPLIES) ×2 IMPLANT
PADDING CAST COTTON 4X4 STRL (CAST SUPPLIES)
PADDING CAST COTTON 6X4 STRL (CAST SUPPLIES) ×1 IMPLANT
PASSER SUT SWANSON 36MM LOOP (INSTRUMENTS) ×1 IMPLANT
PIN TROCAR POINT 2MM-1 (PIN) ×1 IMPLANT
SPONGE GAUZE 4X4 12PLY (GAUZE/BANDAGES/DRESSINGS) ×1 IMPLANT
SPONGE GAUZE 4X4 16PLY UNSTER (WOUND CARE) ×1 IMPLANT
SPONGE LAP 18X18 X RAY DECT (DISPOSABLE) ×1 IMPLANT
SPONGE LAP 4X18 X RAY DECT (DISPOSABLE) ×2 IMPLANT
STAPLER VISISTAT 35W (STAPLE) ×1 IMPLANT
STOCKINETTE IMPERVIOUS LG (DRAPES) ×1 IMPLANT
STRIP CLOSURE SKIN 1/2X4 (GAUZE/BANDAGES/DRESSINGS) ×2 IMPLANT
SUCTION FRAZIER TIP 10 FR DISP (SUCTIONS) ×2 IMPLANT
SUT FIBERWIRE #2 38 T-5 BLUE (SUTURE) ×10
SUT VIC AB 0 CT1 27 (SUTURE) ×2
SUT VIC AB 0 CT1 27XBRD ANBCTR (SUTURE) ×1 IMPLANT
SUT VIC AB 1 CT1 27 (SUTURE) ×2
SUT VIC AB 1 CT1 27XBRD ANBCTR (SUTURE) IMPLANT
SUT VIC AB 2-0 CT1 27 (SUTURE) ×4
SUT VIC AB 2-0 CT1 TAPERPNT 27 (SUTURE) ×2 IMPLANT
SUT VIC AB 4-0 PS2 27 (SUTURE) ×1 IMPLANT
SUTURE FIBERWR #2 38 T-5 BLUE (SUTURE) IMPLANT
SYR CONTROL 10ML LL (SYRINGE) ×1 IMPLANT
TOWEL OR 17X24 6PK STRL BLUE (TOWEL DISPOSABLE) ×2 IMPLANT
TOWEL OR 17X26 10 PK STRL BLUE (TOWEL DISPOSABLE) ×2 IMPLANT
TUBE CONNECTING 12X1/4 (SUCTIONS) ×2 IMPLANT
UNDERPAD 30X30 INCONTINENT (UNDERPADS AND DIAPERS) ×2 IMPLANT
WATER STERILE IRR 1000ML POUR (IV SOLUTION) ×1 IMPLANT
YANKAUER SUCT BULB TIP NO VENT (SUCTIONS) ×1 IMPLANT

## 2012-12-17 NOTE — Anesthesia Preprocedure Evaluation (Signed)
Anesthesia Evaluation  Patient identified by MRN, date of birth, ID band Patient awake    Reviewed: Allergy & Precautions, H&P , NPO status , Patient's Chart, lab work & pertinent test results  Airway Mallampati: I TM Distance: >3 FB Neck ROM: Full    Dental  (+) Teeth Intact and Dental Advisory Given   Pulmonary  breath sounds clear to auscultation        Cardiovascular Rhythm:Regular Rate:Normal     Neuro/Psych    GI/Hepatic   Endo/Other    Renal/GU      Musculoskeletal   Abdominal (+) + obese,  Abdomen: soft.    Peds  Hematology   Anesthesia Other Findings   Reproductive/Obstetrics                           Anesthesia Physical Anesthesia Plan  ASA: I  Anesthesia Plan: General   Post-op Pain Management:    Induction: Intravenous  Airway Management Planned: LMA  Additional Equipment:   Intra-op Plan:   Post-operative Plan:   Informed Consent: I have reviewed the patients History and Physical, chart, labs and discussed the procedure including the risks, benefits and alternatives for the proposed anesthesia with the patient or authorized representative who has indicated his/her understanding and acceptance.   Dental advisory given  Plan Discussed with: CRNA and Surgeon  Anesthesia Plan Comments: (L quadriceps tendon tear  Plan GA with LMA )        Anesthesia Quick Evaluation

## 2012-12-17 NOTE — Progress Notes (Signed)
Anesthesia Chart Review:  Patient is a 57 year old male scheduled for left quadriceps tendon repair by Dr. Ophelia Charter on 12/17/12.  He sustained an acute LLE injury on 12/13/12.  History includes former smoker, HTN, LLE surgery for fracture '88, low back pain, ACEI cough.  Preoperative labs and CXR noted.  EKG from 12/16/12 showed NSR, cannot rule out anterior infarct.  He also has non-specific ST/T wave abnormality in inferior leads.  Reverse r wave progression in V2-3 new since 06/02/10.  No CV symptoms documented.  He has no known history of MI/CHF or DM.  If he remains asymptomatic then would anticipate he could proceed as planned.  Velna Ochs [facility]/Anesthesiology Phone [phone] 12/17/2012 1:27 PM

## 2012-12-17 NOTE — Interval H&P Note (Signed)
History and Physical Interval Note:  12/17/2012 1:51 PM  [place]  has presented today for surgery, with the diagnosis of Left Quad Tendon Rupture  The various methods of treatment have been discussed with the patient and family. After consideration of risks, benefits and other options for treatment, the patient has consented to  Procedure(s) with comments: REPAIR QUADRICEP TENDON (Left) - Left Quad Tendon Repair as a surgical intervention .  The patient's history has been reviewed, patient examined, no change in status, stable for surgery.  I have reviewed the patient's chart and labs.  Questions were answered to the patient's satisfaction.     YATES,MARK C

## 2012-12-17 NOTE — H&P (Signed)
[facility]   A Division of [facility]   [address], [place], [place] [address] Telephone: [phone]  Fax: [phone]     PATIENT: [name], [name]   MR#: [id number]  DOB: September 30, 1956       A 57 year old male here for orthopaedic consultation at the request of [facility].  Patient had date of injury 12/13/2012.  Orthopaedic consult concerning acute left knee injury on 12/13/2012.  He was not at work.  He was outside.  Slipped on the ice and states his knee hyperflexed underneath him when he went down with sharp pain and inability to extend his knee after that.  He was given a knee immobilizer, which he has not really been wearing much since he states it feels tight.  He has crutches.  He is here with his wife.  He works at [employer].  Patient drives a truck.  Works for [employer]  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  Sometimes uses a hand cart.  He has had pain, swelling in his knee, inability to extend his knee.  No opposite knee problems.  He states he has not jogged in about a year.  Had been active.  Had an old injury to his right tibial plateau back in 1990 with a tibial plateau fracture, which was treated with operative fixation, healed and he was back working and active again.  Patient states he did use to have some mild aching in his knees after activities.   SOCIAL HISTORY:  Married to his wife, [name] [name].  Does not smoke.     CURRENT MEDICATIONS:   None.     REVIEW OF SYSTEMS:  Positive for borderline hypertension.  Otherwise negative.     PHYSICAL EXAMINATION:  Height: 6 feet. Weight: 220 pounds.  Patient alert and oriented, WD, WN, NAD.  Extraocular movements are intact.  Shoulders, upper extremities show good muscle development.  Good motor strength.  Normal sensation.  Lungs:  Clear.  No accessory muscle inspiratory effort.  Heart:  Regular rate and rhythm.  Abdomen:  Soft, nontender.  No pain with hip range of  motion.  There is 4+ left knee hemarthrosis.  Palpable defect in the quad tendon.  Patellar tendon appears normal.  Distal pulses are 2+ sensation is intact.  No ecchymosis is noted.     RADIOGRAPHS:  X-rays are reviewed from [facility], which shows patella baja with fleck calcification.  Approximately in the quad tendon with a 3 to 4 cm gap.   ASSESSMENT:  Acute left quad tendon rupture.  By palpation, this extends to the collateral ligaments.   PLAN:  He will need operative repair of a quad tendon.  We discussed the importance of using his knee immobilizer since he does not have extensor function at this moment.  We discussed length of time he would be out of work.  Work slip given.  No work x4 weeks.  He understands it is going to be about 3 months before he is able to ambulate with a fairly normal gait without use of any ambulatory devices.  We discussed nothing to eat or drink after midnight.  Pain medication, preoperative lab work, overnight stay in the hospital, if he is able to get around well the day after surgery.  Preoperative femoral nerve block discussed, general anesthesia.  We discussed short/long-term disability.  I do not think he will need to be on long-term disability.  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  Importance in compliance with therapy discussed, avoid having a stiff knee and also to avoid having inadequate quad tendon rehab so he has a good, strong leg and can walk up stairs, etc.  Appropriate questions were elicited and answered.  We discussed surgery, informed consent obtained verbally.     Thank you for the opportunity to see him in consultation and I will keep you updated  on his progress.     For additional information please see handwritten notes, reports, orders and prescriptions in this chart.      Mark C. Ophelia Charter, M.D.    Auto-Authenticated by Veverly Fells. Ophelia Charter, M.D.

## 2012-12-17 NOTE — Brief Op Note (Cosign Needed)
12/17/2012  4:11 PM  PATIENT:  [name] [name]  57 y.o. male  PRE-OPERATIVE DIAGNOSIS:  Left Quad Tendon Rupture  POST-OPERATIVE DIAGNOSIS:  Left Quad Tendon Rupture  PROCEDURE:  Procedure(s) with comments: REPAIR QUADRICEP TENDON (Left) - Left Quad Tendon Repair  SURGEON:  Surgeon(s) and Role:    * Eldred Manges, MD - Primary  PHYSICIAN ASSISTANT: Maud Deed PAC  ASSISTANTS: none   ANESTHESIA:   general  EBL:  Total I/O In: 800 [I.V.:800] Out: -   BLOOD ADMINISTERED:none  DRAINS: none   LOCAL MEDICATIONS USED:  MARCAINE     SPECIMEN:  No Specimen  DISPOSITION OF SPECIMEN:  N/A  COUNTS:  YES  TOURNIQUET:   Total Tourniquet Time Documented: Thigh (Left) - 30 minutes Total: Thigh (Left) - 30 minutes   DICTATION: .Note written in EPIC  PLAN OF CARE: Admit for overnight observation  PATIENT DISPOSITION:  PACU - hemodynamically stable.   Delay start of Pharmacological VTE agent (>24hrs) due to surgical blood loss or risk of bleeding: yes

## 2012-12-17 NOTE — Transfer of Care (Signed)
Immediate Anesthesia Transfer of Care Note  Patient: [name] [name]  Procedure(s) Performed: Procedure(s) with comments: REPAIR QUADRICEP TENDON (Left) - Left Quad Tendon Repair  Patient Location: PACU  Anesthesia Type:General  Level of Consciousness: awake, alert , oriented and patient cooperative  Airway & Oxygen Therapy: Patient Spontanous Breathing and Patient connected to nasal cannula oxygen  Post-op Assessment: Report given to PACU RN, Post -op Vital signs reviewed and stable and Patient moving all extremities X 4  Post vital signs: Reviewed and stable  Complications: No apparent anesthesia complications

## 2012-12-17 NOTE — Preoperative (Signed)
Beta Blockers   Reason not to administer Beta Blockers:Not Applicable 

## 2012-12-17 NOTE — Anesthesia Procedure Notes (Signed)
Procedure Name: LMA Insertion Date/Time: 12/17/2012 2:47 PM Performed by: Orvilla Fus A Pre-anesthesia Checklist: Patient identified, Timeout performed, Emergency Drugs available, Suction available and Patient being monitored Patient Re-evaluated:Patient Re-evaluated prior to inductionOxygen Delivery Method: Circle system utilized Preoxygenation: Pre-oxygenation with 100% oxygen Intubation Type: IV induction Ventilation: Mask ventilation without difficulty LMA: LMA inserted LMA Size: 5.0 Number of attempts: 1 Placement Confirmation: breath sounds checked- equal and bilateral and positive ETCO2 Tube secured with: Tape Dental Injury: Teeth and Oropharynx as per pre-operative assessment

## 2012-12-17 NOTE — Anesthesia Postprocedure Evaluation (Signed)
  Anesthesia Post-op Note  Patient: [name] [name]  Procedure(s) Performed: Procedure(s) with comments: REPAIR QUADRICEP TENDON (Left) - Left Quad Tendon Repair  Patient Location: PACU  Anesthesia Type:General  Level of Consciousness: awake, alert  and oriented  Airway and Oxygen Therapy: Patient Spontanous Breathing and Patient connected to nasal cannula oxygen  Post-op Pain: none  Post-op Assessment: Post-op Vital signs reviewed, Patient's Cardiovascular Status Stable, Respiratory Function Stable, Patent Airway, NAUSEA AND VOMITING PRESENT and Pain level controlled  Post-op Vital Signs: stable  Complications: No apparent anesthesia complications

## 2012-12-18 ENCOUNTER — Telehealth: Payer: Self-pay | Admitting: *Deleted

## 2012-12-18 MED ORDER — OXYCODONE-ACETAMINOPHEN 5-325 MG PO TABS: 1.0000 | ORAL_TABLET | ORAL | Status: DC | PRN

## 2012-12-18 NOTE — Evaluation (Signed)
Physical Therapy Evaluation Patient Details Name: [name] Frank MRN: [id number] DOB: [birth year] Today's Date: 12/18/2012 Time: 8295-6213 PT Time Calculation (min): 26 min  PT Assessment / Plan / Recommendation Clinical Impression  Pt is 57 y.o. s/p L quadriceps tendon repair. Pt has been using crutches to ambulate since sunday. Will have 24hr assistance at home. Would benefit from OPPT when cleard by MD. Pt does not require acute PT services at this time; safe to D/C home with 24hr support from PT stand point.     PT Assessment  Patent does not need any further PT services    Follow Up Recommendations  Outpatient PT    Does the patient have the potential to tolerate intense rehabilitation      Barriers to Discharge        Equipment Recommendations   (3-in1 commode; shower bench)    Recommendations for Other Services     Frequency      Precautions / Restrictions Precautions Precautions: Fall Required Braces or Orthoses: Knee Immobilizer - Left Knee Immobilizer - Left: On at all times Restrictions Weight Bearing Restrictions: Yes LLE Weight Bearing: Weight bearing as tolerated   Pertinent Vitals/Pain 3/10. Premedicated.      Mobility  Bed Mobility Bed Mobility: Supine to Sit;Sitting - Scoot to Edge of Bed Supine to Sit: 6: Modified independent (Device/Increase time);With rails Sitting - Scoot to Edge of Bed: 6: Modified independent (Device/Increase time);With rail Details for Bed Mobility Assistance: cues for sequencing; able to assist L LE with UEs to advance to EOB. Transfers Transfers: Sit to Stand;Stand to Sit Sit to Stand: 5: Supervision;From bed;With upper extremity assist Stand to Sit: 4: Min guard;With upper extremity assist;With armrests;To chair/3-in-1 Details for Transfer Assistance: cues for hand placement and sequencing  Ambulation/Gait Ambulation/Gait Assistance: 6: Modified independent (Device/Increase time) Ambulation Distance (Feet): 20  Feet Assistive device: Crutches Ambulation/Gait Assistance Details: min cues for safety with ambulation and sequencing Gait Pattern: Step-to pattern Gait velocity: decreased Stairs: No Wheelchair Mobility Wheelchair Mobility: No    Exercises     PT Diagnosis:    PT Problem List:   PT Treatment Interventions:     PT Goals    Visit Information  Last PT Received On: 12/18/12 Assistance Needed: +1    Subjective Data  Subjective: ready to go home. lets get going Patient Stated Goal: home with wife   Prior Functioning  Home Living Lives With: Spouse Available Help at Discharge: Available 24 hours/day;Family Type of Home: House Home Access: Level entry Home Layout: Able to live on main level with bedroom/bathroom;Two level Bathroom Shower/Tub: Health visitor: Standard Prior Function Level of Independence: Independent Able to Take Stairs?: Yes Driving: Yes Vocation: Full time employment Communication Communication: No difficulties    Cognition  Cognition Overall Cognitive Status: Appears within functional limits for tasks assessed/performed Arousal/Alertness: Awake/alert Orientation Level: Appears intact for tasks assessed Behavior During Session: [facility] for tasks performed    Extremity/Trunk Assessment Right Lower Extremity Assessment RLE ROM/Strength/Tone: Within functional levels RLE Sensation: WFL - Light Touch Left Lower Extremity Assessment LLE ROM/Strength/Tone: Deficits;Due to precautions LLE ROM/Strength/Tone Deficits: not allowed to bend L knee due to precautions.  LLE Sensation: WFL - Light Touch Trunk Assessment Trunk Assessment: Normal   Balance Balance Balance Assessed: Yes Static Standing Balance Static Standing - Level of Assistance: 5: Stand by assistance Dynamic Standing Balance Dynamic Standing - Level of Assistance: 5: Stand by assistance  End of Session PT - End of Session Equipment Utilized During Treatment:  Gait belt;Left  knee immobilizer Activity Tolerance: Patient tolerated treatment well Patient left: in chair;with call bell/phone within reach;with family/visitor present Nurse Communication: Mobility status  GP Functional Assessment Tool Used: Clinical Judgement Functional Limitation: Mobility: Walking and moving around Mobility: Walking and Moving Around Current Status (Z6109): At least 1 percent but less than 20 percent impaired, limited or restricted Mobility: Walking and Moving Around Goal Status [phone]): At least 1 percent but less than 20 percent impaired, limited or restricted Mobility: Walking and Moving Around Discharge Status [phone]): At least 1 percent but less than 20 percent impaired, limited or restricted   Donell Sievert, [place] [phone] 12/18/2012, 9:47 AM

## 2012-12-18 NOTE — Progress Notes (Signed)
Subjective: 1 Day Post-Op Procedure(s) (LRB): REPAIR QUADRICEP TENDON (Left) Patient reports pain as mild.  Very comfortable. Wants to go home today after PT  Objective: Vital signs in last 24 hours: Temp:  [98 F (36.7 C)-98.7 F (37.1 C)] 98.3 F (36.8 C) (03/13 0528) Pulse Rate:  [78-95] 83 (03/13 0528) Resp:  [11-26] 16 (03/13 0528) BP: (105-176)/(66-98) 131/68 mmHg (03/13 0528) SpO2:  [95 %-100 %] 100 % (03/13 0528)  Intake/Output from previous day: 03/12 0701 - 03/13 0700 In: 920 [P.O.:120; I.V.:800] Out: 850 [Urine:850] Intake/Output this shift:     Recent Labs  12/16/12 1330  HGB 15.2    Recent Labs  12/16/12 1330  WBC 9.5  RBC 5.36  HCT 43.5  PLT 177    Recent Labs  12/16/12 1330  NA 139  K 4.2  CL 104  CO2 24  BUN 11  CREATININE 1.14  GLUCOSE 95  CALCIUM 9.5    Recent Labs  12/16/12 1330  INR 1.00    Neurovascular intact Sensation intact distally Intact pulses distally Dorsiflexion/Plantar flexion intact Incision: dressing C/D/I  Assessment/Plan: 1 Day Post-Op Procedure(s) (LRB): REPAIR QUADRICEP TENDON (Left) Up with therapy Dc home if does well with PT.  WBAT, no ROM, knee imm at all times. Assess crutches vs walker and other home needs. OV next week.  Anticipate getting hinged knee brace when he comes to office. Instructed in ankle pumps, ice packs, no motion Percocet rx and OTC asa qd  VERNON,SHEILA M 12/18/2012, 8:52 AM

## 2012-12-18 NOTE — Op Note (Signed)
NAME:  [name] Frank, [name] Frank NO.:  [id number]  MEDICAL RECORD NO.:  [id number]  LOCATION:  [id number]                        FACILITY:  [facility]  PHYSICIAN:  Mark C. Ophelia Charter, M.D.    DATE OF BIRTH:  [birth year]  DATE OF PROCEDURE:  12/17/2012 DATE OF DISCHARGE:                              OPERATIVE REPORT   PREOPERATIVE DIAGNOSIS:  Left complete quad tendon rupture.  POSTOPERATIVE DIAGNOSIS:  Left complete quad tendon rupture.  PROCEDURE:  Repair of left quad tendon rupture.  SURGEON:  Mark C. Ophelia Charter, M.D.  ASSISTANT:  Maud Deed, PA-C, medically necessary and present for the entire procedure.  ANESTHESIA:  GOT plus Marcaine 30 mL.  TOURNIQUET TIME:  30 minutes.  BRIEF HISTORY:  This patient slipped on the ice, sat down, hyperflexed knee with inability to extend his knee and palpable defect above the quad tendon, and an inability to extend his knee with patella baja radiographic and soft tissue calcification and the quad tendon consistent with quad tendon tendinopathy long-standing.  PROCEDURE IN DETAIL:  After induction of general anesthesia, preoperative Ancef prophylaxis, proximal thigh tourniquet application, time-out procedure completed.  Prepping with DuraPrep.  The usual extremity sheets, drapes, towel, towel clip around the tourniquet, impervious stockinette, Coban and extremity sheets and drapes were applied.  Leg was wrapped with an Esmarch.  The tourniquet was inflated. Midline incision was made after Coban had been applied and skin had been marked.  Superficial retinaculum was divided.  Tense hemarthrosis was evacuated as soon as the suprapatellar bursa was divided using the suction to decompress the joint.  No loose pieces of bone were present in the joint.  Quad tendon was shredded with chronic tendinopathy type changes.  Tear extended to the medial and lateral collateral ligaments. Sutures placed on the medial side starting deep with figure-of-eight  #2 FiberWire and held with hemostats.  Drill holes were placed.  Total 8 drill holes with 7 sutures individually passed with a Bunnell weave up to the tendon and then coming through the drill holes into the patella for the repair.  Once these were tied down, sutures were tied on the medial side starting at the apex by the medial collateral ligament and then horizontal mattress sutures were used to the lateral and the deep retinaculum on the lateral side to complete the repair.  After irrigation with saline solution, tourniquet was deflated.  A 2-0 Vicryl was placed in the superficial retinaculum, subcutaneous tissue, subcuticular closure.  Tincture of benzoin, Steri-Strips, Marcaine infiltration, total 30 mL were placed in the skin, subcutaneous tissue, and into the knee joint for postoperative analgesia.  Instrument count and needle count were correct.  The patient was transferred to the recovery room.  Procedure was as posted.     Mark C. Ophelia Charter, M.D.     MCY/MEDQ  D:  12/17/2012  T:  12/18/2012  Job:  [id number]

## 2012-12-18 NOTE — Telephone Encounter (Signed)
Message copied by Jacqualyn Posey on Thu Dec 18, 2012  8:17 AM ------      Message from: Meda Coffee      Created: Wed Dec 17, 2012  1:20 PM       Call pt - pt was identified during pre op visit for being high risk for OSA.  I suggest OV to further evaluate ------

## 2012-12-18 NOTE — Progress Notes (Signed)
UR COMPLETED  

## 2012-12-18 NOTE — Telephone Encounter (Signed)
Left message on pts cell phone to call and schedule OV

## 2012-12-22 ENCOUNTER — Encounter (HOSPITAL_COMMUNITY): Payer: Self-pay | Admitting: Orthopaedic Surgery

## 2012-12-23 NOTE — Discharge Summary (Signed)
Physician Discharge Summary  Patient ID: [name] Frank MRN: [id number] DOB/AGE: 1955-11-15 57 y.o.  Admit date: 12/17/2012 Discharge date: 12/18/2012  Admission Diagnoses:  Traumatic rupture of quadriceps tendon left  Discharge Diagnoses:  Principal Problem:   Traumatic rupture of quadriceps tendon left  Past Medical History  Diagnosis Date  . Fracture of leg 1988    right leg  . Hypertension   . ACE-inhibitor cough   . Low back pain     Surgeries: Procedure(s): REPAIR LEFT QUADRICEP TENDON on 12/17/2012   Consultants (if any):  NONE  Discharged Condition: Improved  Hospital Course: [name] Frank is an 57 y.o. male who was admitted 12/17/2012 with a diagnosis of Traumatic rupture of quadriceps tendon and went to the operating room on 12/17/2012 and underwent the above named procedures.    He was given perioperative antibiotics:  Anti-infectives   Start     Dose/Rate Route Frequency Ordered Stop   12/17/12 2100  ceFAZolin (ANCEF) IVPB 2 g/50 mL premix     2 g 100 mL/hr over 30 Minutes Intravenous Every 6 hours 12/17/12 1942 12/18/12 0957   12/17/12 0600  ceFAZolin (ANCEF) IVPB 2 g/50 mL premix     2 g 100 mL/hr over 30 Minutes Intravenous On call to O.R. 12/16/12 1415 12/17/12 1448    .  He was given sequential compression devices, early ambulation, and ASPIRIN for DVT prophylaxis.  He benefited maximally from the hospital stay and there were no complications.    Recent vital signs:  Filed Vitals:   12/18/12 0528  BP: 131/68  Pulse: 83  Temp: 98.3 F (36.8 C)  Resp: 16    Recent laboratory studies:  Lab Results  Component Value Date   HGB 15.2 12/16/2012   Lab Results  Component Value Date   WBC 9.5 12/16/2012   PLT 177 12/16/2012   Lab Results  Component Value Date   INR 1.00 12/16/2012   Lab Results  Component Value Date   NA 139 12/16/2012   K 4.2 12/16/2012   CL 104 12/16/2012   CO2 24 12/16/2012   BUN 11 12/16/2012   CREATININE 1.14 12/16/2012    GLUCOSE 95 12/16/2012    Discharge Medications:     Medication List    TAKE these medications       oxyCODONE-acetaminophen 5-325 MG per tablet  Commonly known as:  PERCOCET/ROXICET  Take 1-2 tablets by mouth every 4 (four) hours as needed.        Diagnostic Studies: Dg Chest 2 View  12/16/2012  *RADIOLOGY REPORT*  Clinical Data: Preop quadriceps tendon repair.Dry cough.  CHEST - 2 VIEW  Comparison: None  Findings: Heart and mediastinal contours are within normal limits. No focal opacities or effusions.  No acute bony abnormality.  IMPRESSION: No active cardiopulmonary disease.   Original Report Authenticated By: Charlett Nose, M.D.     Disposition: 01-Home or Self Care      Discharge Orders   Future Orders Complete By Expires     Call MD / Call 911  As directed     Comments:      If you experience chest pain or shortness of breath, CALL 911 and be transported to the hospital emergency room.  If you develope a fever above 101 F, pus (white drainage) or increased drainage or redness at the wound, or calf pain, call your surgeon's office.    Constipation Prevention  As directed     Comments:      Drink plenty  of fluids.  Prune juice may be helpful.  You may use a stool softener, such as Colace (over the counter) 100 mg twice a day.  Use MiraLax (over the counter) for constipation as needed.    Diet - low sodium heart healthy  As directed     Discharge instructions  As directed     Comments:      NO MOTION OF KNEE.  WEAR IMMOBILIZER AT ALL TIMES.  ICE PACKS AS NEEDED. ANKLE PUMPS REGULARLY.  ASPIRIN 325MG  DAILY. CHANGE DRESSING IF NEEDED, OTHERWISE KEEP INTACT AND DRY UNTIL RETURN VISIT.    Increase activity slowly as tolerated  As directed        Follow-up Information   Follow up with YATES,MARK C, MD. Schedule an appointment as soon as possible for a visit in 1 week.   Contact information:   [address] [place] [address] [phone]         Signed: Wende Neighbors 12/23/2012, 4:43 PM

## 2014-04-23 ENCOUNTER — Ambulatory Visit (INDEPENDENT_AMBULATORY_CARE_PROVIDER_SITE_OTHER): Payer: BC Managed Care – PPO | Admitting: Physician Assistant

## 2014-04-23 ENCOUNTER — Encounter: Payer: Self-pay | Admitting: Physician Assistant

## 2014-04-23 VITALS — BP 152/102 | HR 79 | Temp 98.2°F | Resp 16 | Ht 72.0 in | Wt 219.0 lb

## 2014-04-23 DIAGNOSIS — I10 Essential (primary) hypertension: Secondary | ICD-10-CM

## 2014-04-23 DIAGNOSIS — Z0000 Encounter for general adult medical examination without abnormal findings: Secondary | ICD-10-CM

## 2014-04-23 NOTE — Progress Notes (Signed)
Patient presents to clinic today for annual exam.  Patient is fasting for labs.  Acute Concerns: Patient needing paperwork to continue [facility].  Chronic Issues: Hypertension -- Patient with documented diagnosis.  Not currently on any medication.  Endorses eating pork sandwich prior to visit.  BP elevated in clinic.  Patient denies headache, vision changes, chest pain, palpitations, lightheadedness.    Health Maintenance: Dental -- up-to-date Vision -- overdue Immunizations -- Tetanus 2011. Colonoscopy -- Colonoscopy in 2008; no abnormalities.  Past Medical History  Diagnosis Date  . Fracture of leg 1988    right leg  . Hypertension   . ACE-inhibitor cough   . Low back pain     Past Surgical History  Procedure Laterality Date  . Leg surgery      RIGHT  . Quadriceps tendon repair Left 12/17/2012    Procedure: REPAIR QUADRICEP TENDON;  Surgeon: Eldred Manges, MD;  Location: [facility] OR;  Service: Orthopedics;  Laterality: Left;  Left Quad Tendon Repair    No current outpatient prescriptions on file prior to visit.   No current facility-administered medications on file prior to visit.    No Known Allergies  Family History  Problem Relation Age of Onset  . Hypertension Mother     History   Social History  . Marital Status: Married    Spouse Name: N/A    Number of Children: N/A  . Years of Education: N/A   Occupational History  . Not on file.   Social History Main Topics  . Smoking status: Never Smoker   . Smokeless tobacco: Never Used  . Alcohol Use: No  . Drug Use: No  . Sexual Activity: Not on file   Other Topics Concern  . 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  HENT: Negative  for ear discharge, ear pain, hearing loss and tinnitus.   Eyes: Negative for blurred vision, double vision, photophobia and pain.  Respiratory: Negative for cough and shortness of breath.   Cardiovascular: Negative for chest pain and palpitations.  Gastrointestinal: Negative for heartburn, nausea, vomiting, abdominal pain, diarrhea, constipation, blood in stool and melena.  Genitourinary: Negative for dysuria, urgency, frequency, hematuria and flank pain.       Nocturia x 2.  Neurological: Negative for dizziness, loss of consciousness and headaches.  Psychiatric/Behavioral: Negative for depression, suicidal ideas, hallucinations and substance abuse. The patient is not nervous/anxious and does not have insomnia.    BP 152/102  Pulse 79  Temp(Src) 98.2 F (36.8 C) (Oral)  Resp 16  Ht 6' (1.829 m)  Wt 219 lb (99.338 kg)  BMI 29.70 kg/m2  SpO2 99%  Physical Exam  Vitals reviewed. Constitutional: He is oriented to person, place, and time and well-developed, well-nourished, and in no distress.  HENT:  Head: Normocephalic and atraumatic.  Right Ear: External ear normal.  Left Ear: External ear normal.  Nose: Nose normal.  Mouth/Throat: Oropharynx is clear and moist. No oropharyngeal exudate.  Eyes: Conjunctivae are normal. Pupils are equal, round, and reactive to light.  Neck: Neck supple. No thyromegaly present.  Cardiovascular: Normal rate, regular rhythm and normal  heart sounds.   Pulmonary/Chest: Effort normal and breath sounds normal. No respiratory distress.  Abdominal: Soft. Bowel sounds are normal. He exhibits no distension and no mass. There is no tenderness. There is no rebound and no guarding.  Genitourinary:  Patient defers.  Lymphadenopathy:    He has no cervical adenopathy.  Neurological: He is alert and oriented to person, place, and time. No cranial nerve deficit.  Skin: Skin is warm and dry. No rash noted.  Psychiatric: Affect normal.   Assessment/Plan: Visit for  preventive health examination Medical history reviewed.  Tetanus in 2011. Health Maintenance UTD. Will obtain fasting labs.  Forms for foster parenting signed.  HYPERTENSION BP elevated in clinic today.  Encouraged DASH diet.  Return in 2 weeks for BP recheck.  If consistently elevated, will need Rx.

## 2014-04-23 NOTE — Patient Instructions (Signed)
Please return for blood work.  Come to the office fasting for 8 hours.  I will call you with your results.  It was a pleasure to make your acquaintance.

## 2014-04-23 NOTE — Progress Notes (Signed)
Pre visit review using our clinic review tool, if applicable. No additional management support is needed unless otherwise documented below in the visit note/SLS  

## 2014-04-25 DIAGNOSIS — Z0000 Encounter for general adult medical examination without abnormal findings: Secondary | ICD-10-CM | POA: Insufficient documentation

## 2014-04-25 NOTE — Assessment & Plan Note (Signed)
BP elevated in clinic today.  Encouraged DASH diet.  Return in 2 weeks for BP recheck.  If consistently elevated, will need Rx.

## 2014-04-25 NOTE — Assessment & Plan Note (Signed)
Medical history reviewed.  Tetanus in 2011. Health Maintenance UTD. Will obtain fasting labs.  Forms for foster parenting signed.

## 2015-01-17 ENCOUNTER — Telehealth: Payer: Self-pay | Admitting: Physician Assistant

## 2015-01-17 NOTE — Telephone Encounter (Signed)
Caller name: Donivin Relation to AV:WUJWpt:SELF Call back number: [phone] CEL. Pharmacy: [facility] on [address], [place]  Reason for call: Pt came in office requesting for rx Viagra. Please advise

## 2015-01-17 NOTE — Telephone Encounter (Signed)
Pt scheduled appt for Friday 01-21-15 at 3:30plm.

## 2015-01-17 NOTE — Telephone Encounter (Signed)
Will discuss at appointment.

## 2015-01-19 NOTE — Telephone Encounter (Signed)
Again will discuss at visit.  Will not send in until appointment.

## 2015-01-19 NOTE — Telephone Encounter (Signed)
LVM to return call.

## 2015-01-21 ENCOUNTER — Ambulatory Visit (INDEPENDENT_AMBULATORY_CARE_PROVIDER_SITE_OTHER): Payer: BLUE CROSS/BLUE SHIELD | Admitting: Physician Assistant

## 2015-01-21 ENCOUNTER — Encounter: Payer: Self-pay | Admitting: Physician Assistant

## 2015-01-21 VITALS — BP 148/102 | HR 79 | Temp 98.2°F | Resp 16 | Ht 72.0 in | Wt 222.2 lb

## 2015-01-21 DIAGNOSIS — N529 Male erectile dysfunction, unspecified: Secondary | ICD-10-CM

## 2015-01-21 DIAGNOSIS — I10 Essential (primary) hypertension: Secondary | ICD-10-CM

## 2015-01-21 MED ORDER — HYDROCHLOROTHIAZIDE 12.5 MG PO TABS: 12.5000 mg | ORAL_TABLET | Freq: Every day | ORAL | Status: DC

## 2015-01-21 MED ORDER — SILDENAFIL CITRATE 20 MG PO TABS: ORAL_TABLET | ORAL | Status: DC

## 2015-01-21 NOTE — Patient Instructions (Signed)
Please take medications as directed. Limit salt intake. Follow-up in one month.  Hypertension Hypertension, commonly called high blood pressure, is when the force of blood pumping through your arteries is too strong. Your arteries are the blood vessels that carry blood from your heart throughout your body. A blood pressure reading consists of a higher number over a lower number, such as 110/72. The higher number (systolic) is the pressure inside your arteries when your heart pumps. The lower number (diastolic) is the pressure inside your arteries when your heart relaxes. Ideally you want your blood pressure below 120/80. Hypertension forces your heart to work harder to pump blood. Your arteries may become narrow or stiff. Having hypertension puts you at risk for heart disease, stroke, and other problems.  RISK FACTORS Some risk factors for high blood pressure are controllable. Others are not.  Risk factors you cannot control include:   Race. You may be at higher risk if you are African American.  Age. Risk increases with age.  Gender. Men are at higher risk than women before age 745 years. After age 58, women are at higher risk than men. Risk factors you can control include:  Not getting enough exercise or physical activity.  Being overweight.  Getting too much fat, sugar, calories, or salt in your diet.  Drinking too much alcohol. SIGNS AND SYMPTOMS Hypertension does not usually cause signs or symptoms. Extremely high blood pressure (hypertensive crisis) may cause headache, anxiety, shortness of breath, and nosebleed. DIAGNOSIS  To check if you have hypertension, your health care provider will measure your blood pressure while you are seated, with your arm held at the level of your heart. It should be measured at least twice using the same arm. Certain conditions can cause a difference in blood pressure between your right and left arms. A blood pressure reading that is higher than normal on  one occasion does not mean that you need treatment. If one blood pressure reading is high, ask your health care provider about having it checked again. TREATMENT  Treating high blood pressure includes making lifestyle changes and possibly taking medicine. Living a healthy lifestyle can help lower high blood pressure. You may need to change some of your habits. Lifestyle changes may include:  Following the DASH diet. This diet is high in fruits, vegetables, and whole grains. It is low in salt, red meat, and added sugars.  Getting at least 2 hours of brisk physical activity every week.  Losing weight if necessary.  Not smoking.  Limiting alcoholic beverages.  Learning ways to reduce stress. If lifestyle changes are not enough to get your blood pressure under control, your health care provider may prescribe medicine. You may need to take more than one. Work closely with your health care provider to understand the risks and benefits. HOME CARE INSTRUCTIONS  Have your blood pressure rechecked as directed by your health care provider.   Take medicines only as directed by your health care provider. Follow the directions carefully. Blood pressure medicines must be taken as prescribed. The medicine does not work as well when you skip doses. Skipping doses also puts you at risk for problems.   Do not smoke.   Monitor your blood pressure at home as directed by your health care provider. SEEK MEDICAL CARE IF:   You think you are having a reaction to medicines taken.  You have recurrent headaches or feel dizzy.  You have swelling in your ankles.  You have trouble with your vision.  SEEK IMMEDIATE MEDICAL CARE IF:  You develop a severe headache or confusion.  You have unusual weakness, numbness, or feel faint.  You have severe chest or abdominal pain.  You vomit repeatedly.  You have trouble breathing. MAKE SURE YOU:   Understand these instructions.  Will watch your  condition.  Will get help right away if you are not doing well or get worse. Document Released: 09/24/2005 Document Revised: 02/08/2014 Document Reviewed: 07/17/2013 [facility] Patient Information 2015 [place], [place]. This information is not intended to replace advice given to you by your health care provider. Make sure you discuss any questions you have with your health care provider.

## 2015-01-21 NOTE — Assessment & Plan Note (Signed)
Begin generic sildenafil,40 mg as directed. Supportive measures discussed with patient. Follow-up in one month.

## 2015-01-21 NOTE — Progress Notes (Signed)
    Patient presents to clinic today c/o gradually worsening erectile dysfunction. Patient endorses inability to achieve erection sufficient enough for penetration. Patient without history of hyperlipidemia or diabetes. Denies trauma or injury. Does have history of hypertension, previously well controlled with diet and exercise. Patient denies chest pain, palpitations, lightheadedness, dizziness, vision changes or frequent headaches.  BP Readings from Last 3 Encounters:  01/21/15 148/102  04/23/14 152/102  12/18/12 131/68    Past Medical History  Diagnosis Date  . Fracture of leg 1988    right leg  . Hypertension   . ACE-inhibitor cough   . Low back pain     No current outpatient prescriptions on file prior to visit.   No current facility-administered medications on file prior to visit.    No Known Allergies  Family History  Problem Relation Age of Onset  . Hypertension Mother     History   Social History  . Marital Status: Married    Spouse Name: N/A  . Number of Children: N/A  . Years of Education: N/A   Social History Main Topics  . Smoking status: Never Smoker   . Smokeless tobacco: Never Used  . Alcohol Use: No  . Drug Use: No  . Sexual Activity: Not on file   Other Topics Concern  . 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  All other ROS are negative.  BP 148/102 mmHg  Pulse 79  Temp(Src) 98.2 F (36.8 C) (Oral)  Resp 16  Ht 6' (1.829 m)  Wt 222 lb 4 oz (100.812 kg)  BMI 30.14 kg/m2  SpO2 96%  Physical Exam  Constitutional: He is oriented to person, place, and time and well-developed, well-nourished, and in no distress.  HENT:  Head: Normocephalic and atraumatic.  Eyes: Conjunctivae are normal.  Cardiovascular:  Normal rate, regular rhythm, normal heart sounds and intact distal pulses.   Pulmonary/Chest: Effort normal and breath sounds normal. No respiratory distress. He has no wheezes. He has no rales. He exhibits no tenderness.  Neurological: He is alert and oriented to person, place, and time.  Skin: Skin is warm and dry. No rash noted.  Psychiatric: Affect normal.  Vitals reviewed.   No results found for this or any previous visit (from the past 2160 hour(s)).  Assessment/Plan: Erectile dysfunction Begin generic sildenafil,40 mg as directed. Supportive measures discussed with patient. Follow-up in one month.     Essential hypertension Above goal. We'll begin hydrochlorothiazide 12.5 mg daily. DASH diet discussed. Handout given. Follow-up in one month. We'll titrate medication until blood pressure is normotensive.

## 2015-01-21 NOTE — Assessment & Plan Note (Signed)
Above goal. We'll begin hydrochlorothiazide 12.5 mg daily. DASH diet discussed. Handout given. Follow-up in one month. We'll titrate medication until blood pressure is normotensive.

## 2015-01-21 NOTE — Progress Notes (Signed)
Pre visit review using our clinic review tool, if applicable. No additional management support is needed unless otherwise documented below in the visit note/SLS  

## 2015-02-25 ENCOUNTER — Encounter: Payer: Self-pay | Admitting: Physician Assistant

## 2015-02-25 ENCOUNTER — Ambulatory Visit (INDEPENDENT_AMBULATORY_CARE_PROVIDER_SITE_OTHER): Payer: BLUE CROSS/BLUE SHIELD | Admitting: Physician Assistant

## 2015-02-25 ENCOUNTER — Ambulatory Visit: Payer: BLUE CROSS/BLUE SHIELD | Admitting: Physician Assistant

## 2015-02-25 VITALS — BP 137/71 | HR 83 | Temp 98.2°F | Ht 72.0 in | Wt 223.0 lb

## 2015-02-25 DIAGNOSIS — I10 Essential (primary) hypertension: Secondary | ICD-10-CM

## 2015-02-25 MED ORDER — SILDENAFIL CITRATE 100 MG PO TABS: 50.0000 mg | ORAL_TABLET | Freq: Every day | ORAL | Status: DC | PRN

## 2015-02-25 NOTE — Progress Notes (Signed)
   Patient presents to clinic today for 2335-month follow-up of hypertension. Patient endorses taking his Hydrochlorothiazide daily as directed.  Endorses headaches have resolved with medication. Patient denies chest pain, palpitations, lightheadedness, dizziness, vision changes.  Past Medical History  Diagnosis Date  . Fracture of leg 1988    right leg  . Hypertension   . ACE-inhibitor cough   . Low back pain     Current Outpatient Prescriptions on File Prior to Visit  Medication Sig Dispense Refill  . hydrochlorothiazide (HYDRODIURIL) 12.5 MG tablet Take 1 tablet (12.5 mg total) by mouth daily. 90 tablet 3   No current facility-administered medications on file prior to visit.    No Known Allergies  Family History  Problem Relation Age of Onset  . Hypertension Mother     History   Social History  . Marital Status: Married    Spouse Name: N/A  . Number of Children: N/A  . Years of Education: N/A   Social History Main Topics  . Smoking status: Never Smoker   . Smokeless tobacco: Never Used  . Alcohol Use: No  . Drug Use: No  . Sexual Activity: Not on file   Other Topics Concern  . 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  All other ROS are negative.  BP 137/71 mmHg  Pulse 83  Temp(Src) 98.2 F (36.8 C) (Oral)  Ht 6' (1.829 m)  Wt 223 lb (101.152 kg)  BMI 30.24 kg/m2  SpO2 98%  Physical Exam  Constitutional: He is oriented to person, place, and time and well-developed, well-nourished, and in no distress.  HENT:  Head: Normocephalic and atraumatic.  Eyes: Conjunctivae are normal.  Neck: Neck supple.  Cardiovascular: Normal rate, regular rhythm, normal heart sounds and intact distal pulses.   Pulmonary/Chest: Effort normal and breath  sounds normal. No respiratory distress. He has no wheezes. He has no rales. He exhibits no tenderness.  Neurological: He is alert and oriented to person, place, and time.  Skin: Skin is warm and dry. No rash noted.  Psychiatric: Affect normal.  Vitals reviewed.  Assessment/Plan: Essential hypertension Improved.  Asymptomatic.  Tolerating medications well.  Will continue current medication regimen. DASH diet again encouraged. Follow-up 6 months.

## 2015-02-25 NOTE — Assessment & Plan Note (Signed)
Improved.  Asymptomatic.  Tolerating medications well.  Will continue current medication regimen. DASH diet again encouraged. Follow-up 6 months.

## 2015-02-25 NOTE — Progress Notes (Signed)
Pre visit review using our clinic review tool, if applicable. No additional management support is needed unless otherwise documented below in the visit note. 

## 2015-02-25 NOTE — Patient Instructions (Signed)
I am glad your blood pressure medication is helping.  Continue as directed. Limit salt intake.  Try to see how much the Viagra is using the voucher I gave you.  Let me know if there are any issues.  Follow-up 6 months.  Hypertension Hypertension, commonly called high blood pressure, is when the force of blood pumping through your arteries is too strong. Your arteries are the blood vessels that carry blood from your heart throughout your body. A blood pressure reading consists of a higher number over a lower number, such as 110/72. The higher number (systolic) is the pressure inside your arteries when your heart pumps. The lower number (diastolic) is the pressure inside your arteries when your heart relaxes. Ideally you want your blood pressure below 120/80. Hypertension forces your heart to work harder to pump blood. Your arteries may become narrow or stiff. Having hypertension puts you at risk for heart disease, stroke, and other problems.  RISK FACTORS Some risk factors for high blood pressure are controllable. Others are not.  Risk factors you cannot control include:   Race. You may be at higher risk if you are African American.  Age. Risk increases with age.  Gender. Men are at higher risk than women before age 58 years. After age 58, women are at higher risk than men. Risk factors you can control include:  Not getting enough exercise or physical activity.  Being overweight.  Getting too much fat, sugar, calories, or salt in your diet.  Drinking too much alcohol. SIGNS AND SYMPTOMS Hypertension does not usually cause signs or symptoms. Extremely high blood pressure (hypertensive crisis) may cause headache, anxiety, shortness of breath, and nosebleed. DIAGNOSIS  To check if you have hypertension, your health care provider will measure your blood pressure while you are seated, with your arm held at the level of your heart. It should be measured at least twice using the same arm.  Certain conditions can cause a difference in blood pressure between your right and left arms. A blood pressure reading that is higher than normal on one occasion does not mean that you need treatment. If one blood pressure reading is high, ask your health care provider about having it checked again. TREATMENT  Treating high blood pressure includes making lifestyle changes and possibly taking medicine. Living a healthy lifestyle can help lower high blood pressure. You may need to change some of your habits. Lifestyle changes may include:  Following the DASH diet. This diet is high in fruits, vegetables, and whole grains. It is low in salt, red meat, and added sugars.  Getting at least 2 hours of brisk physical activity every week.  Losing weight if necessary.  Not smoking.  Limiting alcoholic beverages.  Learning ways to reduce stress. If lifestyle changes are not enough to get your blood pressure under control, your health care provider may prescribe medicine. You may need to take more than one. Work closely with your health care provider to understand the risks and benefits. HOME CARE INSTRUCTIONS  Have your blood pressure rechecked as directed by your health care provider.   Take medicines only as directed by your health care provider. Follow the directions carefully. Blood pressure medicines must be taken as prescribed. The medicine does not work as well when you skip doses. Skipping doses also puts you at risk for problems.   Do not smoke.   Monitor your blood pressure at home as directed by your health care provider. SEEK MEDICAL CARE IF:  You think you are having a reaction to medicines taken.  You have recurrent headaches or feel dizzy.  You have swelling in your ankles.  You have trouble with your vision. SEEK IMMEDIATE MEDICAL CARE IF:  You develop a severe headache or confusion.  You have unusual weakness, numbness, or feel faint.  You have severe chest or  abdominal pain.  You vomit repeatedly.  You have trouble breathing. MAKE SURE YOU:   Understand these instructions.  Will watch your condition.  Will get help right away if you are not doing well or get worse. Document Released: 09/24/2005 Document Revised: 02/08/2014 Document Reviewed: 07/17/2013 [facility] Patient Information 2015 [place], [place]. This information is not intended to replace advice given to you by your health care provider. Make sure you discuss any questions you have with your health care provider.

## 2015-08-26 ENCOUNTER — Ambulatory Visit (INDEPENDENT_AMBULATORY_CARE_PROVIDER_SITE_OTHER): Payer: BLUE CROSS/BLUE SHIELD | Admitting: Physician Assistant

## 2015-08-26 ENCOUNTER — Encounter: Payer: Self-pay | Admitting: Physician Assistant

## 2015-08-26 VITALS — BP 124/84 | HR 87 | Temp 98.1°F | Resp 16 | Ht 72.0 in | Wt 212.2 lb

## 2015-08-26 DIAGNOSIS — I10 Essential (primary) hypertension: Secondary | ICD-10-CM | POA: Diagnosis not present

## 2015-08-26 NOTE — Progress Notes (Signed)
Pre visit review using our clinic review tool, if applicable. No additional management support is needed unless otherwise documented below in the visit note/SLS  

## 2015-08-26 NOTE — Patient Instructions (Signed)
Please continue medications as directed. Limit salt intake.  Follow-up 6 months. Return sooner as needed.

## 2015-08-26 NOTE — Assessment & Plan Note (Signed)
Well-controlled. Asymptomatic. Follow-up 6 months.

## 2015-08-26 NOTE — Progress Notes (Signed)
    Patient presents to clinic today for follow-up of hypertension.  Is taking HCTZ daily without side effects. Patient denies chest pain, palpitations, lightheadedness, dizziness, vision changes or frequent headaches.    BP Readings from Last 3 Encounters:  08/26/15 124/84  02/25/15 137/71  01/21/15 148/102     Past Medical History  Diagnosis Date  . Fracture of leg 1988    right leg  . Hypertension   . ACE-inhibitor cough   . Low back pain     Current Outpatient Prescriptions on File Prior to Visit  Medication Sig Dispense Refill  . hydrochlorothiazide (HYDRODIURIL) 12.5 MG tablet Take 1 tablet (12.5 mg total) by mouth daily. 90 tablet 3  . sildenafil (VIAGRA) 100 MG tablet Take 0.5-1 tablets (50-100 mg total) by mouth daily as needed for erectile dysfunction. 6 tablet 1   No current facility-administered medications on file prior to visit.    No Known Allergies  Family History  Problem Relation Age of Onset  . Hypertension Mother     Social History   Social History  . Marital Status: Married    Spouse Name: N/A  . Number of Children: N/A  . Years of Education: N/A   Social History Main Topics  . Smoking status: Never Smoker   . Smokeless tobacco: Never Used  . Alcohol Use: No  . Drug Use: No  . Sexual Activity: Not Asked   Other Topics Concern  . 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  All other ROS are negative.  BP 124/84 mmHg  Pulse 87  Temp(Src) 98.1 F (36.7 C) (Oral)  Resp 16  Ht 6' (1.829 m)  Wt 212 lb 4 oz (96.276 kg)  BMI 28.78 kg/m2  SpO2 100%  Physical Exam  Constitutional: He is oriented to person, place, and time and well-developed, well-nourished, and in no distress.  HENT:  Head: Normocephalic and  atraumatic.  Cardiovascular: Normal rate, regular rhythm, normal heart sounds and intact distal pulses.   Pulmonary/Chest: Effort normal and breath sounds normal. No respiratory distress. He has no wheezes. He has no rales. He exhibits no tenderness.  Neurological: He is alert and oriented to person, place, and time.  Skin: Skin is warm and dry. No rash noted.  Psychiatric: Affect normal.  Vitals reviewed.   No results found for this or any previous visit (from the past 2160 hour(s)).  Assessment/Plan: Essential hypertension Well-controlled. Asymptomatic. Follow-up 6 months.

## 2015-11-29 ENCOUNTER — Telehealth: Payer: Self-pay | Admitting: Physician Assistant

## 2015-11-29 NOTE — Telephone Encounter (Signed)
LVM inquiring if patient received flu shot  °

## 2016-02-29 ENCOUNTER — Telehealth: Payer: Self-pay | Admitting: Physician Assistant

## 2016-02-29 ENCOUNTER — Other Ambulatory Visit: Payer: Self-pay | Admitting: Physician Assistant

## 2016-02-29 NOTE — Telephone Encounter (Signed)
Rx sent to the pharmacy by e-script.//AB/CMA 

## 2016-02-29 NOTE — Telephone Encounter (Signed)
Relation to AV:WUJWpt:self Call back number:[phone] [id number] - [place], [place] - [address]  Reason for call:  Patient requesting a refill hydrochlorothiazide

## 2016-02-29 NOTE — Telephone Encounter (Signed)
Rx is been refilled.//AB/CMA 

## 2016-03-02 ENCOUNTER — Ambulatory Visit (INDEPENDENT_AMBULATORY_CARE_PROVIDER_SITE_OTHER): Payer: BLUE CROSS/BLUE SHIELD | Admitting: Physician Assistant

## 2016-03-02 VITALS — BP 124/70 | HR 81 | Temp 98.1°F | Ht 72.0 in | Wt 215.5 lb

## 2016-03-02 DIAGNOSIS — I10 Essential (primary) hypertension: Secondary | ICD-10-CM

## 2016-03-02 MED ORDER — HYDROCHLOROTHIAZIDE 12.5 MG PO TABS: 12.5000 mg | ORAL_TABLET | Freq: Every day | ORAL | Status: DC

## 2016-03-02 MED ORDER — SILDENAFIL CITRATE 20 MG PO TABS: ORAL_TABLET | ORAL | Status: DC

## 2016-03-02 NOTE — Progress Notes (Signed)
Pre visit review using our clinic review tool, if applicable. No additional management support is needed unless otherwise documented below in the visit note. 

## 2016-03-02 NOTE — Patient Instructions (Signed)
Continue BP medications as directed. Schedule an appointment for a complete physical, ASAP  Start the generic Viagra. Call [facility] at [phone] to get the prescription mailed to you.

## 2016-03-02 NOTE — Progress Notes (Signed)
    Patient presents to clinic today for follow-up of hypertension. Patient is currently on regimen of HCTZ 12.5 mg daily. Endorses doing very well on this regimen. Patient denies chest pain, palpitations, lightheadedness, dizziness, vision changes or frequent headaches.  BP Readings from Last 3 Encounters:  03/02/16 124/70  08/26/15 124/84  02/25/15 137/71   Past Medical History  Diagnosis Date  . Fracture of leg 1988    right leg  . Hypertension   . ACE-inhibitor cough   . Low back pain     Current Outpatient Prescriptions on File Prior to Visit  Medication Sig Dispense Refill  . hydrochlorothiazide (HYDRODIURIL) 12.5 MG tablet TAKE 1 TABLET DAILY 90 tablet 0  . sildenafil (VIAGRA) 100 MG tablet Take 0.5-1 tablets (50-100 mg total) by mouth daily as needed for erectile dysfunction. (Patient not taking: Reported on 03/02/2016) 6 tablet 1   No current facility-administered medications on file prior to visit.    No Known Allergies  Family History  Problem Relation Age of Onset  . Hypertension Mother     Social History   Social History  . Marital Status: Married    Spouse Name: N/A  . Number of Children: N/A  . Years of Education: N/A   Social History Main Topics  . Smoking status: Never Smoker   . Smokeless tobacco: Never Used  . Alcohol Use: No  . Drug Use: No  . Sexual Activity: Not on file   Other Topics Concern  . 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  All other ROS are negative.  BP 124/70 mmHg  Pulse 81  Temp(Src) 98.1 F (36.7 C) (Oral)  Ht 6' (1.829 m)  Wt 215 lb 8 oz (97.75 kg)  BMI 29.22 kg/m2  SpO2 97%  Physical Exam  Constitutional: He is oriented to person, place, and time and well-developed,  well-nourished, and in no distress.  HENT:  Head: Normocephalic and atraumatic.  Eyes: Conjunctivae are normal.  Cardiovascular: Normal rate, regular rhythm, normal heart sounds and intact distal pulses.   Pulmonary/Chest: Effort normal. No respiratory distress. He has no wheezes. He has no rales. He exhibits no tenderness.  Neurological: He is alert and oriented to person, place, and time.  Skin: Skin is warm and dry. No rash noted.  Psychiatric: Affect normal.  Vitals reviewed.   No results found for this or any previous visit (from the past 2160 hour(s)).  Assessment/Plan: 1. Essential hypertension Doing very well on current regimen. Will continue current regimen. Will check labs at upcoming CPE.  - hydrochlorothiazide (HYDRODIURIL) 12.5 MG tablet; Take 1 tablet (12.5 mg total) by mouth daily.  Dispense: 90 tablet; Refill: 0  Piedad ClimesMartin, Traci Plemons [place], [place]PA-C

## 2016-03-05 ENCOUNTER — Encounter: Payer: Self-pay | Admitting: Physician Assistant

## 2016-04-20 ENCOUNTER — Encounter: Payer: BLUE CROSS/BLUE SHIELD | Admitting: Physician Assistant

## 2016-04-20 DIAGNOSIS — Z0289 Encounter for other administrative examinations: Secondary | ICD-10-CM

## 2016-04-24 ENCOUNTER — Encounter: Payer: Self-pay | Admitting: Physician Assistant

## 2016-05-29 ENCOUNTER — Other Ambulatory Visit: Payer: Self-pay | Admitting: Physician Assistant

## 2017-01-12 ENCOUNTER — Other Ambulatory Visit: Payer: Self-pay | Admitting: Physician Assistant

## 2017-02-14 ENCOUNTER — Telehealth: Payer: Self-pay | Admitting: Physician Assistant

## 2017-02-14 NOTE — Telephone Encounter (Signed)
[place] to [place] being far, patient would like CPE at the time of appointment, please advise

## 2017-02-14 NOTE — Telephone Encounter (Signed)
He can switch to me. Can do cpe also provided he is not sick/with no acute illness. Explain sick visit, with new patient and cpe can't be done. But willing do do cpe on 1st visit.

## 2017-02-14 NOTE — Telephone Encounter (Signed)
Ok with me 

## 2017-02-15 NOTE — Telephone Encounter (Signed)
Patient scheduled for 03/01/2017 with [name] Frank

## 2017-02-28 ENCOUNTER — Telehealth: Payer: Self-pay | Admitting: Behavioral Health

## 2017-02-28 NOTE — Telephone Encounter (Signed)
Unable to reach patient for Pre-Visit Call. Per recording, the patient cannot accept calls at this time.

## 2017-03-01 ENCOUNTER — Encounter: Payer: BLUE CROSS/BLUE SHIELD | Admitting: Medical

## 2017-03-11 ENCOUNTER — Ambulatory Visit (INDEPENDENT_AMBULATORY_CARE_PROVIDER_SITE_OTHER): Payer: BLUE CROSS/BLUE SHIELD | Admitting: Medical

## 2017-03-11 ENCOUNTER — Encounter: Payer: Self-pay | Admitting: Medical

## 2017-03-11 VITALS — BP 145/90 | HR 88 | Temp 98.2°F | Resp 16 | Ht 72.0 in | Wt 220.2 lb

## 2017-03-11 DIAGNOSIS — I10 Essential (primary) hypertension: Secondary | ICD-10-CM

## 2017-03-11 MED ORDER — HYDROCHLOROTHIAZIDE 12.5 MG PO TABS: 12.5000 mg | ORAL_TABLET | Freq: Every day | ORAL | 3 refills | Status: DC

## 2017-03-11 NOTE — Patient Instructions (Addendum)
Your bp is borderline today but you are without your medications over past month. I sent in refill today. Please get cmp today to check your kidney function and potassium level.  Please schedule follow up CPE in one month.

## 2017-03-11 NOTE — Progress Notes (Signed)
Subjective:    Patient ID: [name] Frank, male    DOB: 1955/11/06, 61 y.o.   MRN: [id number]  HPI  Pt in for evaluation.   Pt is a Naval architect. Local truck driver. Pt has been out of bp med for 1 month at least. No cardiac or neurologic symptoms.  Pt has history of ED. He states generic viagra did give him a ha. Told to stop in the past. He does not want any med for ED presently.   Review of Systems  Constitutional: Negative for chills and fatigue.  Respiratory: Negative for cough, chest tightness, wheezing and stridor.   Cardiovascular: Negative for chest pain and palpitations.  Gastrointestinal: Negative for abdominal pain.  Genitourinary: Negative for difficulty urinating, discharge, frequency, penile pain and testicular pain.       Erectile dysfunction  Skin: Negative for rash.  Neurological: Negative for dizziness, tremors, speech difficulty, weakness and headaches.  Hematological: Negative for adenopathy. Does not bruise/bleed easily.  Psychiatric/Behavioral: Negative for behavioral problems and confusion.    Past Medical History:  Diagnosis Date  . ACE-inhibitor cough   . Fracture of leg 1988   right leg  . Hypertension   . Low back pain      Social History   Social History  . Marital status: Married    Spouse name: N/A  . Number of children: N/A  . Years of education: N/A   Occupational History  . Not on file.   Social History Main Topics  . Smoking status: Never Smoker  . Smokeless tobacco: Never Used  . Alcohol use No  . Drug use: No  . Sexual activity: Not on file   Other Topics Concern  . 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 LEG SURGERY     RIGHT  . QUADRICEPS TENDON REPAIR  Left 12/17/2012   Procedure: REPAIR QUADRICEP TENDON;  Surgeon: Eldred Manges, MD;  Location: [facility];  Service: Orthopedics;  Laterality: Left;  Left Quad Tendon Repair    Family History  Problem Relation Age of Onset  . Hypertension Mother     No Known Allergies  Current Outpatient Prescriptions on File Prior to Visit  Medication Sig Dispense Refill  . hydrochlorothiazide (HYDRODIURIL) 12.5 MG tablet TAKE 1 TABLET DAILY 30 tablet 0  . sildenafil (REVATIO) 20 MG tablet Take 1-2 tablets by mouth 30 minutes prior to intercourse for erectile dysfunction 50 tablet 0   No current facility-administered medications on file prior to visit.     BP (!) 149/95 (BP Location: Right Arm, Patient Position: Sitting, Cuff Size: Large)   Pulse 88   Temp 98.2 F (36.8 C) (Oral)   Resp 16   Ht 6' (1.829 m)   Wt 220 lb 3.2 oz (99.9 kg)   SpO2 99%   BMI 29.86 kg/m       Objective:   Physical Exam  General Mental Status- Alert. General Appearance- Not in acute distress.   . Chest and Lung Exam Auscultation: Breath Sounds:-Normal.  Cardiovascular Auscultation:Rythm- Regular. Murmurs & Other Heart Sounds:Auscultation of the heart reveals- No Murmurs.  Neurologic Cranial Nerve exam:-  CN III-XII intact(No nystagmus), symmetric smile. Strength:- 5/5 equal and symmetric strength both upper and lower extremities.      Assessment & Plan:  Your bp is borderline today but you are without your medications over past month. I sent in refill today. Please get cmp today to check your kidney function and potassium level.  Please schedule follow up CPE in one month.  Pt declined rx for ED today. When he in on follow up [facility]. Apparently everything written in past was too expensive and then had side effect moderate ha with viagra so he was told not to take.  Cabrina Shiroma, Ramon DredgeEdward, PA-C

## 2017-03-12 LAB — COMPREHENSIVE METABOLIC PANEL
ALBUMIN: 4.5 g/dL (ref 3.5–5.2)
ALK PHOS: 76 U/L (ref 39–117)
ALT: 18 U/L (ref 0–53)
AST: 19 U/L (ref 0–37)
BUN: 14 mg/dL (ref 6–23)
CALCIUM: 9.8 mg/dL (ref 8.4–10.5)
CHLORIDE: 104 meq/L (ref 96–112)
CO2: 30 mEq/L (ref 19–32)
Creatinine, Ser: 1.34 mg/dL (ref 0.40–1.50)
GFR: 69.86 mL/min (ref >60.00)
Glucose, Bld: 89 mg/dL (ref 70–99)
POTASSIUM: 4.8 meq/L (ref 3.5–5.1)
Sodium: 138 mEq/L (ref 135–145)
TOTAL PROTEIN: 8.1 g/dL (ref 6.0–8.3)
Total Bilirubin: 0.4 mg/dL (ref 0.2–1.2)

## 2017-05-10 ENCOUNTER — Ambulatory Visit: Payer: Self-pay | Admitting: Medical

## 2017-05-10 DIAGNOSIS — Z0289 Encounter for other administrative examinations: Secondary | ICD-10-CM

## 2017-05-22 ENCOUNTER — Telehealth: Payer: Self-pay | Admitting: Medical

## 2017-05-22 NOTE — Telephone Encounter (Signed)
Pt called in because he said that he is in pain. Pt said that he had knee surgery and had pins put in his knee about 10 years ago. Pt said that his knee is hurting and would like to have a referral to either sports medicine or ortho pt isn't sure of which he needs to see. He would like for provider to advise.    Please assist pt further.   CB: [phone] - if needed.

## 2017-05-23 NOTE — Telephone Encounter (Signed)
Thanks.  ° °Pt has been scheduled.  °

## 2017-05-23 NOTE — Telephone Encounter (Signed)
Pt needs an appointment

## 2017-05-24 ENCOUNTER — Ambulatory Visit (INDEPENDENT_AMBULATORY_CARE_PROVIDER_SITE_OTHER): Payer: BLUE CROSS/BLUE SHIELD | Admitting: Medical

## 2017-05-24 ENCOUNTER — Ambulatory Visit (HOSPITAL_BASED_OUTPATIENT_CLINIC_OR_DEPARTMENT_OTHER)
Admission: RE | Admit: 2017-05-24 | Discharge: 2017-05-24 | Disposition: A | Discharge status: Home / Self Care | Payer: BLUE CROSS/BLUE SHIELD | Source: Ambulatory Visit | Attending: Medical | Admitting: Medical

## 2017-05-24 ENCOUNTER — Encounter: Payer: Self-pay | Admitting: Medical

## 2017-05-24 VITALS — BP 140/88 | HR 83 | Temp 98.3°F | Ht 72.0 in | Wt 217.2 lb

## 2017-05-24 DIAGNOSIS — M25561 Pain in right knee: Secondary | ICD-10-CM | POA: Diagnosis not present

## 2017-05-24 DIAGNOSIS — M898X6 Other specified disorders of bone, lower leg: Secondary | ICD-10-CM | POA: Diagnosis not present

## 2017-05-24 DIAGNOSIS — S8991XA Unspecified injury of right lower leg, initial encounter: Secondary | ICD-10-CM | POA: Diagnosis not present

## 2017-05-24 LAB — CBC WITH DIFFERENTIAL/PLATELET
BASOS ABS: 0 {cells}/uL (ref 0–200)
Basophils Relative: 0 %
EOS ABS: 94 {cells}/uL (ref 15–500)
Eosinophils Relative: 1 %
HCT: 42.4 % (ref 38.5–50.0)
Hemoglobin: 14.5 g/dL (ref 13.2–17.1)
Lymphocytes Relative: 31 %
Lymphs Abs: 2914 cells/uL (ref 850–3900)
MCH: 28.8 pg (ref 27.0–33.0)
MCHC: 34.2 g/dL (ref 32.0–36.0)
MCV: 84.1 fL (ref 80.0–100.0)
MONOS PCT: 5 %
MPV: 9.4 fL (ref 7.5–12.5)
Monocytes Absolute: 470 cells/uL (ref 200–950)
NEUTROS PCT: 63 %
Neutro Abs: 5922 cells/uL (ref 1500–7800)
PLATELETS: 196 10*3/uL (ref 140–400)
RBC: 5.04 MIL/uL (ref 4.20–5.80)
RDW: 14 % (ref 11.0–15.0)
WBC: 9.4 10*3/uL (ref 3.8–10.8)

## 2017-05-24 MED ORDER — TRAMADOL HCL 50 MG PO TABS: 50.0000 mg | ORAL_TABLET | Freq: Four times a day (QID) | ORAL | 0 refills | Status: AC | PRN

## 2017-05-24 MED ORDER — DICLOFENAC SODIUM 75 MG PO TBEC: 75.0000 mg | DELAYED_RELEASE_TABLET | Freq: Two times a day (BID) | ORAL | 0 refills | Status: DC
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
## 2017-05-24 NOTE — Patient Instructions (Addendum)
For your rt knee and tibia pain will get xray of both today.   Please get labs today cbc and uric acid.  For pain this weekend can use diclofenac nsaid and for more severe pain can use tramadol.  On Monday when you have to drive recommend just use diclofenac.  Follow up date to be determined. After xray back and we call you may refer to orthopedist.

## 2017-05-25 LAB — URIC ACID: Uric Acid, Serum: 8.3 mg/dL — ABNORMAL HIGH (ref 4.0–8.0)

## 2017-06-03 ENCOUNTER — Encounter: Payer: Self-pay | Admitting: Internal Medicine

## 2017-06-06 ENCOUNTER — Other Ambulatory Visit: Payer: Self-pay

## 2017-06-06 MED ORDER — DICLOFENAC SODIUM 75 MG PO TBEC: 75.0000 mg | DELAYED_RELEASE_TABLET | Freq: Two times a day (BID) | ORAL | 0 refills | Status: DC

## 2017-08-26 ENCOUNTER — Other Ambulatory Visit: Payer: Self-pay | Admitting: Medical

## 2017-08-27 NOTE — Telephone Encounter (Signed)
Pt is due for follow please call and schedule.

## 2017-09-13 NOTE — Telephone Encounter (Signed)
Patient coming in for CPE appointment 09/27/2017.

## 2017-09-27 ENCOUNTER — Encounter: Payer: Self-pay | Admitting: Medical

## 2017-09-27 ENCOUNTER — Ambulatory Visit (INDEPENDENT_AMBULATORY_CARE_PROVIDER_SITE_OTHER): Payer: BLUE CROSS/BLUE SHIELD | Admitting: Medical

## 2017-09-27 VITALS — BP 137/80 | HR 68 | Temp 98.0°F | Resp 16 | Ht 74.0 in | Wt 217.0 lb

## 2017-09-27 DIAGNOSIS — I10 Essential (primary) hypertension: Secondary | ICD-10-CM | POA: Diagnosis not present

## 2017-09-27 DIAGNOSIS — Z125 Encounter for screening for malignant neoplasm of prostate: Secondary | ICD-10-CM

## 2017-09-27 MED ORDER — HYDROCHLOROTHIAZIDE 12.5 MG PO TABS: ORAL_TABLET | ORAL | 1 refills | Status: DC

## 2017-09-27 MED ORDER — SILDENAFIL CITRATE 100 MG PO TABS: 100.0000 mg | ORAL_TABLET | Freq: Every day | ORAL | 0 refills | Status: DC | PRN

## 2017-09-27 NOTE — Progress Notes (Signed)
Subjective:    Patient ID: [name] Frank, male    DOB: 08/26/1956, 61 y.o.   MRN: [id number]  HPI  Pt in for bp med refill. No cardiac or neurologic signs or symptoms.  No side effects. No muscles cramps. Pt on hctz..When pt checks his bp it is usually 130/80.  On review knee pain resolved after treatment   Pt declines flu vaccine.    Review of Systems  Constitutional: Negative for chills and fatigue.  HENT: Negative for congestion, ear discharge, ear pain, hearing loss, mouth sores and nosebleeds.   Respiratory: Negative for cough, chest tightness, shortness of breath and wheezing.   Cardiovascular: Negative for chest pain and palpitations.  Gastrointestinal: Negative for abdominal pain, blood in stool, constipation, diarrhea and vomiting.  Genitourinary: Negative for decreased urine volume, difficulty urinating, enuresis, flank pain, frequency and scrotal swelling.       ED in past and continues.  Musculoskeletal: Negative for back pain and gait problem.  Skin: Negative for rash.  Neurological: Negative for dizziness and headaches.  Hematological: Negative for adenopathy. Does not bruise/bleed easily.  Psychiatric/Behavioral: Negative for behavioral problems and confusion. The patient is not nervous/anxious.      Past Medical History:  Diagnosis Date  . ACE-inhibitor cough   . Fracture of leg 1988   right leg  . Hypertension   . Low back pain      Social History   Socioeconomic History  . Marital status: Married    Spouse name: Not on file  . Number of children: Not on file  . Years of education: Not on file  . Highest education level: Not on file  Social Needs  . Financial resource strain: Not on file  . Food insecurity - worry: Not on file  . Food insecurity - inability: Not on file  . Transportation needs - medical: Not on file  . Transportation needs - non-medical: Not on file  Occupational History  . Not on file  Tobacco Use  . Smoking status: Never  Smoker  . Smokeless tobacco: Never Used  Substance and Sexual Activity  . Alcohol use: No  . Drug use: No  . Sexual activity: Not on file  Other Topics Concern  . 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 LEG SURGERY     RIGHT  . QUADRICEPS TENDON REPAIR Left 12/17/2012   Procedure: REPAIR QUADRICEP TENDON;  Surgeon: Eldred MangesMark C Yates, MD;  [facility];  Service: Orthopedics;  Laterality: Left;  Left Quad Tendon Repair    Family History  Problem Relation Age of Onset  . Hypertension Mother     No Known Allergies  Current Outpatient Medications on File Prior to Visit  Medication Sig Dispense Refill  . diclofenac (VOLTAREN) 75 MG EC tablet Take 1 tablet (75 mg total) by mouth 2 (two) times daily. 30 tablet 0  . hydrochlorothiazide (HYDRODIURIL) 12.5 MG tablet TAKE 1 TABLET(12.5 MG) BY MOUTH DAILY 30 tablet 0  . traMADol (ULTRAM) 50 MG tablet Take 1 tablet (50 mg total) by mouth every 6 (six) hours as needed. 12 tablet 0   No current facility-administered  medications on file prior to visit.     BP 137/81   Pulse 68   Temp 98 F (36.7 C) (Oral)   Resp 16   Ht 6\' 2"  (1.88 m)   Wt 217 lb (98.4 kg)   SpO2 99%   BMI 27.86 kg/m       Objective:   Physical Exam  General Mental Status- Alert. General Appearance- Not in acute distress.   Skin General: Color- Normal Color. Moisture- Normal Moisture.  Neck Carotid Arteries- Normal color. Moisture- Normal Moisture. No carotid bruits. No JVD.  Chest and Lung Exam Auscultation: Breath Sounds:-Normal.  Cardiovascular Auscultation:Rythm- Regular. Murmurs & Other Heart Sounds:Auscultation of the heart reveals- No Murmurs.  Neurologic Cranial Nerve exam:- CN  III-XII intact(No nystagmus), symmetric smile. Strength:- 5/5 equal and symmetric strength both upper and lower extremities.      Assessment & Plan:  Your bp has been good recently and today. Will refill your hctz today.  Please get fasting lipid panel and cmp next month as we discussed.  Healthy diet and exercise recommended.  For ED rx of viagra.(no contradictions)  Follow up date to be determined after lab review.  Cynia Abruzzo, Ramon DredgeEdward, PA-C

## 2017-09-27 NOTE — Patient Instructions (Addendum)
Your bp has been good recently and today. Will refill your hctz today.  Please get fasting lipid panel and cmp next month as we discussed.  Healthy diet and exercise recommended.  For ED rx of viagra.(no contradictions)  Follow up date to be determined after lab review.

## 2017-11-01 ENCOUNTER — Other Ambulatory Visit: Payer: BLUE CROSS/BLUE SHIELD

## 2017-12-02 ENCOUNTER — Telehealth: Payer: Self-pay | Admitting: Medical

## 2017-12-02 ENCOUNTER — Other Ambulatory Visit (INDEPENDENT_AMBULATORY_CARE_PROVIDER_SITE_OTHER): Payer: BLUE CROSS/BLUE SHIELD

## 2017-12-02 DIAGNOSIS — I10 Essential (primary) hypertension: Secondary | ICD-10-CM | POA: Diagnosis not present

## 2017-12-02 DIAGNOSIS — Z125 Encounter for screening for malignant neoplasm of prostate: Secondary | ICD-10-CM | POA: Diagnosis not present

## 2017-12-02 LAB — COMPREHENSIVE METABOLIC PANEL
ALBUMIN: 4.1 g/dL (ref 3.5–5.2)
ALK PHOS: 69 U/L (ref 39–117)
ALT: 17 U/L (ref 0–53)
AST: 16 U/L (ref 0–37)
BILIRUBIN TOTAL: 0.7 mg/dL (ref 0.2–1.2)
BUN: 14 mg/dL (ref 6–23)
CO2: 27 mEq/L (ref 19–32)
Calcium: 9.6 mg/dL (ref 8.4–10.5)
Chloride: 103 mEq/L (ref 96–112)
Creatinine, Ser: 1.54 mg/dL — ABNORMAL HIGH (ref 0.40–1.50)
GFR: 59.36 mL/min — AB (ref >60.00)
GLUCOSE: 100 mg/dL — AB (ref 70–99)
Potassium: 4.1 mEq/L (ref 3.5–5.1)
Sodium: 139 mEq/L (ref 135–145)
TOTAL PROTEIN: 7.4 g/dL (ref 6.0–8.3)

## 2017-12-02 LAB — LIPID PANEL
CHOLESTEROL: 149 mg/dL (ref 0–200)
HDL: 38.6 mg/dL — ABNORMAL LOW (ref >39.00)
LDL Cholesterol: 75 mg/dL (ref 0–99)
NONHDL: 110.85
TRIGLYCERIDES: 177 mg/dL — AB (ref 0.0–149.0)
Total CHOL/HDL Ratio: 4
VLDL: 35.4 mg/dL (ref 0.0–40.0)

## 2017-12-02 LAB — PSA: PSA: 0.27 ng/mL (ref 0.10–4.00)

## 2017-12-02 NOTE — Telephone Encounter (Signed)
Copied from CRM [id number]. Topic: Quick Communication - See Telephone Encounter >> Dec 02, 2017  9:13 AM Valentina LucksMatos, Jackelin wrote: CRM for notification. See Telephone encounter for:  12/02/17.   Pharmacy: [facility] [phone] - [place], [place] - [address]  Pt is requesting refill on diclofenac (VOLTAREN) 75 MG EC tablet. Please advise.

## 2017-12-10 MED ORDER — DICLOFENAC SODIUM 75 MG PO TBEC: 75.0000 mg | DELAYED_RELEASE_TABLET | Freq: Two times a day (BID) | ORAL | 0 refills | Status: DC

## 2017-12-10 NOTE — Telephone Encounter (Signed)
Spoke with lady, advised that rx was sent in and that he needs to follow up for any more refills

## 2018-02-06 ENCOUNTER — Telehealth: Payer: Self-pay | Admitting: Medical

## 2018-02-06 ENCOUNTER — Ambulatory Visit (HOSPITAL_BASED_OUTPATIENT_CLINIC_OR_DEPARTMENT_OTHER)
Admission: RE | Admit: 2018-02-06 | Discharge: 2018-02-06 | Disposition: A | Discharge status: Home / Self Care | Payer: BLUE CROSS/BLUE SHIELD | Source: Ambulatory Visit | Attending: Medical | Admitting: Medical

## 2018-02-06 ENCOUNTER — Encounter: Payer: Self-pay | Admitting: Medical

## 2018-02-06 ENCOUNTER — Ambulatory Visit (INDEPENDENT_AMBULATORY_CARE_PROVIDER_SITE_OTHER): Payer: BLUE CROSS/BLUE SHIELD | Admitting: Medical

## 2018-02-06 VITALS — BP 140/90 | HR 90 | Temp 98.3°F | Resp 16 | Ht 74.0 in | Wt 218.2 lb

## 2018-02-06 DIAGNOSIS — M7989 Other specified soft tissue disorders: Secondary | ICD-10-CM | POA: Diagnosis not present

## 2018-02-06 DIAGNOSIS — R944 Abnormal results of kidney function studies: Secondary | ICD-10-CM

## 2018-02-06 DIAGNOSIS — M25562 Pain in left knee: Secondary | ICD-10-CM | POA: Diagnosis not present

## 2018-02-06 DIAGNOSIS — Z8739 Personal history of other diseases of the musculoskeletal system and connective tissue: Secondary | ICD-10-CM

## 2018-02-06 LAB — CBC WITH DIFFERENTIAL/PLATELET
BASOS ABS: 0 10*3/uL (ref 0.0–0.1)
Basophils Relative: 0.3 % (ref 0.0–3.0)
EOS ABS: 0.1 10*3/uL (ref 0.0–0.7)
Eosinophils Relative: 0.7 % (ref 0.0–5.0)
HCT: 41.9 % (ref 39.0–52.0)
Hemoglobin: 14.4 g/dL (ref 13.0–17.0)
LYMPHS ABS: 1.9 10*3/uL (ref 0.7–4.0)
LYMPHS PCT: 18.4 % (ref 12.0–46.0)
MCHC: 34.4 g/dL (ref 30.0–36.0)
MCV: 85.1 fl (ref 78.0–100.0)
Monocytes Absolute: 0.5 10*3/uL (ref 0.1–1.0)
Monocytes Relative: 5.4 % (ref 3.0–12.0)
NEUTROS PCT: 75.2 % (ref 43.0–77.0)
Neutro Abs: 7.6 10*3/uL (ref 1.4–7.7)
Platelets: 199 10*3/uL (ref 150.0–400.0)
RBC: 4.92 Mil/uL (ref 4.22–5.81)
RDW: 14.1 % (ref 11.5–15.5)
WBC: 10.1 10*3/uL (ref 4.0–10.5)

## 2018-02-06 LAB — URIC ACID: Uric Acid, Serum: 7.9 mg/dL — ABNORMAL HIGH (ref 4.0–7.8)

## 2018-02-06 LAB — COMPREHENSIVE METABOLIC PANEL
ALT: 19 U/L (ref 0–53)
AST: 22 U/L (ref 0–37)
Albumin: 4.2 g/dL (ref 3.5–5.2)
Alkaline Phosphatase: 74 U/L (ref 39–117)
BILIRUBIN TOTAL: 0.6 mg/dL (ref 0.2–1.2)
BUN: 11 mg/dL (ref 6–23)
CALCIUM: 9.4 mg/dL (ref 8.4–10.5)
CO2: 31 meq/L (ref 19–32)
CREATININE: 1.27 mg/dL (ref 0.40–1.50)
Chloride: 104 mEq/L (ref 96–112)
GFR: 74.1 mL/min (ref >60.00)
GLUCOSE: 99 mg/dL (ref 70–99)
Potassium: 4.8 mEq/L (ref 3.5–5.1)
SODIUM: 141 meq/L (ref 135–145)
Total Protein: 7.6 g/dL (ref 6.0–8.3)

## 2018-02-06 MED ORDER — PREDNISONE 10 MG PO TABS: ORAL_TABLET | ORAL | 0 refills | Status: DC

## 2018-02-06 MED ORDER — KETOROLAC TROMETHAMINE 60 MG/2ML IM SOLN
60.0000 mg | Freq: Once | INTRAMUSCULAR | Status: AC
  Administered 2018-02-06: 60 mg via INTRAMUSCULAR

## 2018-02-06 NOTE — Patient Instructions (Addendum)
For your recent acute knee pain, we will get left knee x-ray, CBC and uric acid level.  In the office today we gave you Toradol 60 mg IM injection.  After reviewing uric acid level will probably consider prescribing prednisone taper dose or colchicine.  Might need to consider use of antibiotic as well if WBCs significantly elevated.  Blood work will include CMP to evaluate kidney function.  In addition might need to be on preventative medication for gout.  Follow-up date to be determined after lab review and depending on clinical response to treatment.

## 2018-02-06 NOTE — Progress Notes (Signed)
Subjective:    Patient ID: [name] Frank, male    DOB: 09-14-1956, 62 y.o.   MRN: [id number]  HPI  Pt in with some left knee pain for about a week. No injury or fall. Pt had hx of knee surgery. He fell on ice and had internal derangement that required surgery.  Pt recently tried diclofenac. But did not help at all.  The knee is very tender to light touch. Slight warm. Pt does have history of gout. Pt states last gout flare was about 2 months ago.   Review of Systems  Constitutional: Negative for chills, fatigue and fever.  Respiratory: Negative for cough, chest tightness, shortness of breath and wheezing.   Cardiovascular: Negative for chest pain and palpitations.  Gastrointestinal: Negative for abdominal pain.  Musculoskeletal:       Left knee pain.  Hematological: Negative for adenopathy. Does not bruise/bleed easily.    Past Medical History:  Diagnosis Date  . ACE-inhibitor cough   . Fracture of leg 1988   right leg  . Hypertension   . Low back pain      Social History   Socioeconomic History  . Marital status: Married    Spouse name: Not on file  . Number of children: Not on file  . Years of education: Not on file  . Highest education level: Not on file  Occupational History  . Not on file  Social Needs  . Financial resource strain: Not on file  . Food insecurity:    Worry: Not on file    Inability: Not on file  . Transportation needs:    Medical: Not on file    Non-medical: Not on file  Tobacco Use  . Smoking status: Never Smoker  . Smokeless tobacco: Never Used  Substance and Sexual Activity  . Alcohol use: No  . Drug use: No  . Sexual activity: Not on file  Lifestyle  . Physical activity:    Days per week: Not on file    Minutes per session: Not on file  . Stress: Not on file  Relationships  . Social connections:    Talks on phone: Not on file    Gets together: Not on file    Attends religious service: Not on file    Active member of club  or organization: Not on file    Attends meetings of clubs or organizations: Not on file    Relationship status: Not on file  . Intimate partner violence:    Fear of current or ex partner: Not on file    Emotionally abused: Not on file    Physically abused: Not on file    Forced sexual activity: Not on file  Other Topics Concern  . 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 LEG SURGERY     RIGHT  . QUADRICEPS TENDON REPAIR Left 12/17/2012   Procedure: REPAIR QUADRICEP TENDON;  Surgeon: Eldred Manges, MD;  Location: [facility] OR;  Service: Orthopedics;  Laterality: Left;  Left Quad Tendon Repair    Family History  Problem Relation Age of Onset  . Hypertension Mother  No Known Allergies  Current Outpatient Medications on File Prior to Visit  Medication Sig Dispense Refill  . diclofenac (VOLTAREN) 75 MG EC tablet Take 1 tablet (75 mg total) by mouth 2 (two) times daily. 30 tablet 0  . hydrochlorothiazide (HYDRODIURIL) 12.5 MG tablet TAKE 1 TABLET(12.5 MG) BY MOUTH DAILY 90 tablet 1  . sildenafil (VIAGRA) 100 MG tablet Take 1 tablet (100 mg total) by mouth daily as needed for erectile dysfunction. 10 tablet 0  . traMADol (ULTRAM) 50 MG tablet Take 1 tablet (50 mg total) by mouth every 6 (six) hours as needed. 12 tablet 0   No current facility-administered medications on file prior to visit.     BP (!) 140/91   Pulse 90   Temp 98.3 F (36.8 C) (Oral)   Resp 16   Ht  (1.88 m)   Wt 218 lb 3.2 oz (99 kg)   SpO2 100%   BMI 28.02 kg/m       Objective:   Physical Exam  General- No acute distress. Pleasant patient. Neck- Full range of motion, no jvd Lungs- Clear, even and unlabored. Heart- regular rate and  rhythm. Neurologic- CNII- XII grossly intact.  Left knee pain- moderate swelling. Mild warm to touch. But tender to light touch. Old midline knee scar. Negative homan sign.     Assessment & Plan:  For your recent acute knee pain, we will get left knee x-ray, CBC and uric acid level.  In the office today we gave you Toradol 60 mg IM injection.  After reviewing uric acid level will probably consider prescribing prednisone taper dose or colchicine.  Might need to consider use of antibiotic as well if WBCs significantly elevated.  Blood work will include CMP to evaluate kidney function.  In addition might need to be on preventative medication for gout.  Follow-up date to be determined after lab review and depending on clinical response to treatment.  Esperanza Richters, PA-C

## 2018-02-06 NOTE — Telephone Encounter (Signed)
Rx prednisone taper printed. Will you fax it to his pharmacy.

## 2018-02-07 ENCOUNTER — Telehealth: Payer: Self-pay | Admitting: Medical

## 2018-02-07 MED ORDER — COLCHICINE 0.6 MG PO TABS: 0.6000 mg | ORAL_TABLET | Freq: Two times a day (BID) | ORAL | 2 refills | Status: DC

## 2018-02-07 NOTE — Telephone Encounter (Signed)
cochicine sent to pt pharmacy.

## 2018-02-07 NOTE — Telephone Encounter (Signed)
Faxed to pharmacy

## 2018-02-26 ENCOUNTER — Telehealth: Payer: Self-pay

## 2018-02-26 NOTE — Telephone Encounter (Signed)
Follow up was for his knee pain. Is his pain resolved. If resolved can cancel follow up.

## 2018-02-26 NOTE — Telephone Encounter (Signed)
Copied from CRM [id number]. Topic: Appointment Scheduling - Scheduling Inquiry for Clinic >> Feb 26, 2018  2:03 PM Eston Mould B wrote: Reason for CRM: Pt called to ask if he still needs to come in  on Friday 5/24,   he states he had lab work  done 3 weeks ago and saw Ramon Dredge,  he doesn't want to take off work  if not necessary

## 2018-02-27 NOTE — Telephone Encounter (Signed)
Called pt and pt stated is doing a lot better with his knee, that the meds that the pt was given really helped the pain that pt was having on his knee, (pt wanted to cancel the appt that he had on 02-28-2018 since he does not want to miss out on work and he is feeling a lot better) Pt would like to know if ok with provider to send in some more rx from his last visit that was given to him for his knee pain (pt forgot the name of rx) Please advise.

## 2018-02-28 ENCOUNTER — Ambulatory Visit: Payer: BLUE CROSS/BLUE SHIELD | Admitting: Medical

## 2018-02-28 MED ORDER — DICLOFENAC SODIUM 75 MG PO TBEC: 75.0000 mg | DELAYED_RELEASE_TABLET | Freq: Two times a day (BID) | ORAL | 0 refills | Status: AC

## 2018-02-28 NOTE — Telephone Encounter (Signed)
Patient notified and verbalized understanding. 

## 2018-02-28 NOTE — Telephone Encounter (Signed)
Refill of diclofenac given for patient's knee pain.  Let him know prescription was sent.

## 2018-05-26 ENCOUNTER — Other Ambulatory Visit: Payer: Self-pay | Admitting: Medical

## 2018-10-20 ENCOUNTER — Other Ambulatory Visit: Payer: Self-pay | Admitting: Medical

## 2019-03-06 ENCOUNTER — Encounter: Payer: Self-pay | Admitting: Medical

## 2019-03-06 ENCOUNTER — Other Ambulatory Visit: Payer: Self-pay

## 2019-03-06 ENCOUNTER — Ambulatory Visit (INDEPENDENT_AMBULATORY_CARE_PROVIDER_SITE_OTHER): Payer: BLUE CROSS/BLUE SHIELD | Admitting: Medical

## 2019-03-06 VITALS — BP 132/78 | Ht 74.0 in | Wt 220.0 lb

## 2019-03-06 DIAGNOSIS — Z0000 Encounter for general adult medical examination without abnormal findings: Secondary | ICD-10-CM

## 2019-03-06 DIAGNOSIS — M109 Gout, unspecified: Secondary | ICD-10-CM

## 2019-03-06 DIAGNOSIS — Z1211 Encounter for screening for malignant neoplasm of colon: Secondary | ICD-10-CM | POA: Diagnosis not present

## 2019-03-06 DIAGNOSIS — Z113 Encounter for screening for infections with a predominantly sexual mode of transmission: Secondary | ICD-10-CM

## 2019-03-06 DIAGNOSIS — Z125 Encounter for screening for malignant neoplasm of prostate: Secondary | ICD-10-CM | POA: Diagnosis not present

## 2019-03-06 MED ORDER — HYDROCHLOROTHIAZIDE 12.5 MG PO TABS: ORAL_TABLET | ORAL | 1 refills | Status: DC

## 2019-03-06 NOTE — Progress Notes (Signed)
Subjective:    Patient ID: [name] Frank, male    DOB: 1956/09/13, 63 y.o.   MRN: [id number]  HPI  Virtual Visit via Telephone Note  I connected with [name] Frank on 03/06/19 at  8:20 AM EDT by telephone and verified that I am speaking with the correct person using two identifiers.  Location: Patient: home Provider: home.  Tried virtual visit by vide x 2 but failure of audio. So changed to telephone.   I discussed the limitations, risks, security and privacy concerns of performing an evaluation and management service by telephone and the availability of in person appointments. I also discussed with the patient that there may be a patient responsible charge related to this service. The patient expressed understanding and agreed to proceed.   History of Present Illness:  Wellness exam visit today. He has been exercising some by cutting grass. Some walking. Eating healthier than usual. Staying at home due to lay off.  Rarely going out due to covid pandemic. He states mood is good.  On review negative ROS.   Pt update bp controlled. No side effects. No muscle cramps.  Need refill for colchicine in event of gout flares.    Observations/Objective: General- no acute distress, pleasant, oriented, speech normal.  Assessment and Plan: For you wellness exam today I have ordered cbc, cmp, lipid panel, psa and hiv.(add uric acid for gout hx)  Vaccines up to date.  Not high risk for hep c so pt declined screening.  Placed colonoscopy order  Recommend exercise and healthy diet.  We will let you know lab results as they come in.  Follow up date appointment will be determined after lab review.   Follow Up Instructions:    I discussed the assessment and treatment plan with the patient. The patient was provided an opportunity to ask questions and all were answered. The patient agreed with the plan and demonstrated an understanding of the instructions.   The patient was advised  to call back or seek an in-person evaluation if the symptoms worsen or if the condition fails to improve as anticipated.  Esperanza Richters, PA-C    Review of Systems  Constitutional: Negative for chills, fatigue and fever.  HENT: Negative for congestion and ear discharge.   Eyes: Negative for redness and visual disturbance.  Respiratory: Negative for cough, chest tightness, shortness of breath and wheezing.   Cardiovascular: Negative for chest pain and palpitations.  Gastrointestinal: Negative for abdominal distention, abdominal pain, blood in stool, diarrhea, nausea and vomiting.  Endocrine: Negative for polydipsia, polyphagia and polyuria.  Genitourinary: Negative for decreased urine volume, difficulty urinating, discharge, enuresis, frequency, penile pain and scrotal swelling.  Musculoskeletal: Negative for back pain.  Skin: Negative for rash.  Neurological: Negative for dizziness and headaches.  Hematological: Negative for adenopathy. Does not bruise/bleed easily.  Psychiatric/Behavioral: Negative for behavioral problems, confusion and sleep disturbance. The patient is not nervous/anxious.    Past Medical History:  Diagnosis Date  . ACE-inhibitor cough   . Fracture of leg 1988   right leg  . Hypertension   . Low back pain      Social History   Socioeconomic History  . Marital status: Married    Spouse name: Not on file  . Number of children: Not on file  . Years of education: Not on file  . Highest education level: Not on file  Occupational History  . Not on file  Social Needs  . Financial resource strain: Not  on file  . Food insecurity:    Worry: Not on file    Inability: Not on file  . Transportation needs:    Medical: Not on file    Non-medical: Not on file  Tobacco Use  . Smoking status: Never Smoker  . Smokeless tobacco: Never Used  Substance and Sexual Activity  . Alcohol use: No  . Drug use: No  . Sexual activity: Not on file  Lifestyle  .  Physical activity:    Days per week: Not on file    Minutes per session: Not on file  . Stress: Not on file  Relationships  . Social connections:    Talks on phone: Not on file    Gets together: Not on file    Attends religious service: Not on file    Active member of club or organization: Not on file    Attends meetings of clubs or organizations: Not on file    Relationship status: Not on file  . Intimate partner violence:    Fear of current or ex partner: Not on file    Emotionally abused: Not on file    Physically abused: Not on file    Forced sexual activity: Not on file  Other Topics Concern  . 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 LEG SURGERY     RIGHT  . QUADRICEPS TENDON REPAIR Left 12/17/2012   Procedure: REPAIR QUADRICEP TENDON;  Surgeon: Eldred MangesMark C Yates, MD;  [facility];  Service: Orthopedics;  Laterality: Left;  Left Quad Tendon Repair    Family History  Problem Relation Age of Onset  . Hypertension Mother     No Known Allergies  Current Outpatient Medications on File Prior to Visit  Medication Sig Dispense Refill  . colchicine 0.6 MG tablet Take 1 tablet (0.6 mg total) by mouth 2 (two) times daily. 60 tablet 2  . diclofenac (VOLTAREN) 75 MG EC tablet Take 1 tablet (75 mg total) by mouth 2 (two) times daily. 20 tablet 0  . predniSONE (DELTASONE) 10 MG tablet 5 TAB PO DAY 1 4 TAB PO DAY 2 3 TAB PO DAY 3 2 TAB PO DAY 4 1 TAB PO DAY 5 15 tablet 0  . sildenafil (VIAGRA) 100 MG tablet Take 1 tablet (100 mg total) by mouth daily as needed for erectile dysfunction. 10 tablet 0  . traMADol (ULTRAM) 50 MG tablet Take 1 tablet (50 mg total) by mouth every 6 (six) hours as needed. 12 tablet 0   No current  facility-administered medications on file prior to visit.     BP 132/78   Wt 220 lb (99.8 kg)   BMI 28.25 kg/m       Objective:   Physical Exam        Assessment & Plan:

## 2019-03-06 NOTE — Patient Instructions (Addendum)
For you wellness exam today I have ordered cbc, cmp, lipid panel, psa and hiv.(add uric acid for gout hx)  Vaccines up to date.  Not high risk for hep c so pt declined screening.  Placed colonoscopy order  Recommend exercise and healthy diet.  We will let you know lab results as they come in.  Follow up date appointment will be determined after lab review.    Preventive Care 40-64 Years, Male Preventive care refers to lifestyle choices and visits with your health care provider that can promote health and wellness. What does preventive care include?   A yearly physical exam. This is also called an annual well check.  Dental exams once or twice a year.  Routine eye exams. Ask your health care provider how often you should have your eyes checked.  Personal lifestyle choices, including: ? Daily care of your teeth and gums. ? Regular physical activity. ? Eating a healthy diet. ? Avoiding tobacco and drug use. ? Limiting alcohol use. ? Practicing safe sex. ? Taking low-dose aspirin every day starting at age 65. What happens during an annual well check? The services and screenings done by your health care provider during your annual well check will depend on your age, overall health, lifestyle risk factors, and family history of disease. Counseling Your health care provider may ask you questions about your:  Alcohol use.  Tobacco use.  Drug use.  Emotional well-being.  Home and relationship well-being.  Sexual activity.  Eating habits.  Work and work Statistician. Screening You may have the following tests or measurements:  Height, weight, and BMI.  Blood pressure.  Lipid and cholesterol levels. These may be checked every 5 years, or more frequently if you are over 46 years old.  Skin check.  Lung cancer screening. You may have this screening every year starting at age 13 if you have a 30-pack-year history of smoking and currently smoke or have quit within the  past 15 years.  Colorectal cancer screening. All adults should have this screening starting at age 39 and continuing until age 41. Your health care provider may recommend screening at age 73. You will have tests every 1-10 years, depending on your results and the type of screening test. People at increased risk should start screening at an earlier age. Screening tests may include: ? Guaiac-based fecal occult blood testing. ? Fecal immunochemical test (FIT). ? Stool DNA test. ? Virtual colonoscopy. ? Sigmoidoscopy. During this test, a flexible tube with a tiny camera (sigmoidoscope) is used to examine your rectum and lower colon. The sigmoidoscope is inserted through your anus into your rectum and lower colon. ? Colonoscopy. During this test, a long, thin, flexible tube with a tiny camera (colonoscope) is used to examine your entire colon and rectum.  Prostate cancer screening. Recommendations will vary depending on your family history and other risks.  Hepatitis C blood test.  Hepatitis B blood test.  Sexually transmitted disease (STD) testing.  Diabetes screening. This is done by checking your blood sugar (glucose) after you have not eaten for a while (fasting). You may have this done every 1-3 years. Discuss your test results, treatment options, and if necessary, the need for more tests with your health care provider. Vaccines Your health care provider may recommend certain vaccines, such as:  Influenza vaccine. This is recommended every year.  Tetanus, diphtheria, and acellular pertussis (Tdap, Td) vaccine. You may need a Td booster every 10 years.  Varicella vaccine. You may need this  if you have not been vaccinated.  Zoster vaccine. You may need this after age 40.  Measles, mumps, and rubella (MMR) vaccine. You may need at least one dose of MMR if you were born in 1957 or later. You may also need a second dose.  Pneumococcal 13-valent conjugate (PCV13) vaccine. You may need this  if you have certain conditions and have not been vaccinated.  Pneumococcal polysaccharide (PPSV23) vaccine. You may need one or two doses if you smoke cigarettes or if you have certain conditions.  Meningococcal vaccine. You may need this if you have certain conditions.  Hepatitis A vaccine. You may need this if you have certain conditions or if you travel or work in places where you may be exposed to hepatitis A.  Hepatitis B vaccine. You may need this if you have certain conditions or if you travel or work in places where you may be exposed to hepatitis B.  Haemophilus influenzae type b (Hib) vaccine. You may need this if you have certain risk factors. Talk to your health care provider about which screenings and vaccines you need and how often you need them. This information is not intended to replace advice given to you by your health care provider. Make sure you discuss any questions you have with your health care provider. Document Released: 10/21/2015 Document Revised: 11/14/2017 Document Reviewed: 07/26/2015 Elsevier Interactive Patient Education  2019 [employer].

## 2019-07-24 ENCOUNTER — Telehealth: Payer: Self-pay | Admitting: *Deleted

## 2019-07-24 NOTE — Telephone Encounter (Signed)
error 

## 2019-10-14 IMAGING — DX DG KNEE 3 VIEWS*L*
3 series · 3 of 3 positions shown · non-contrast
Comparison: None.

CLINICAL DATA: One week of left knee pain.

EXAM:
LEFT KNEE - 3 VIEW

[knee ap]
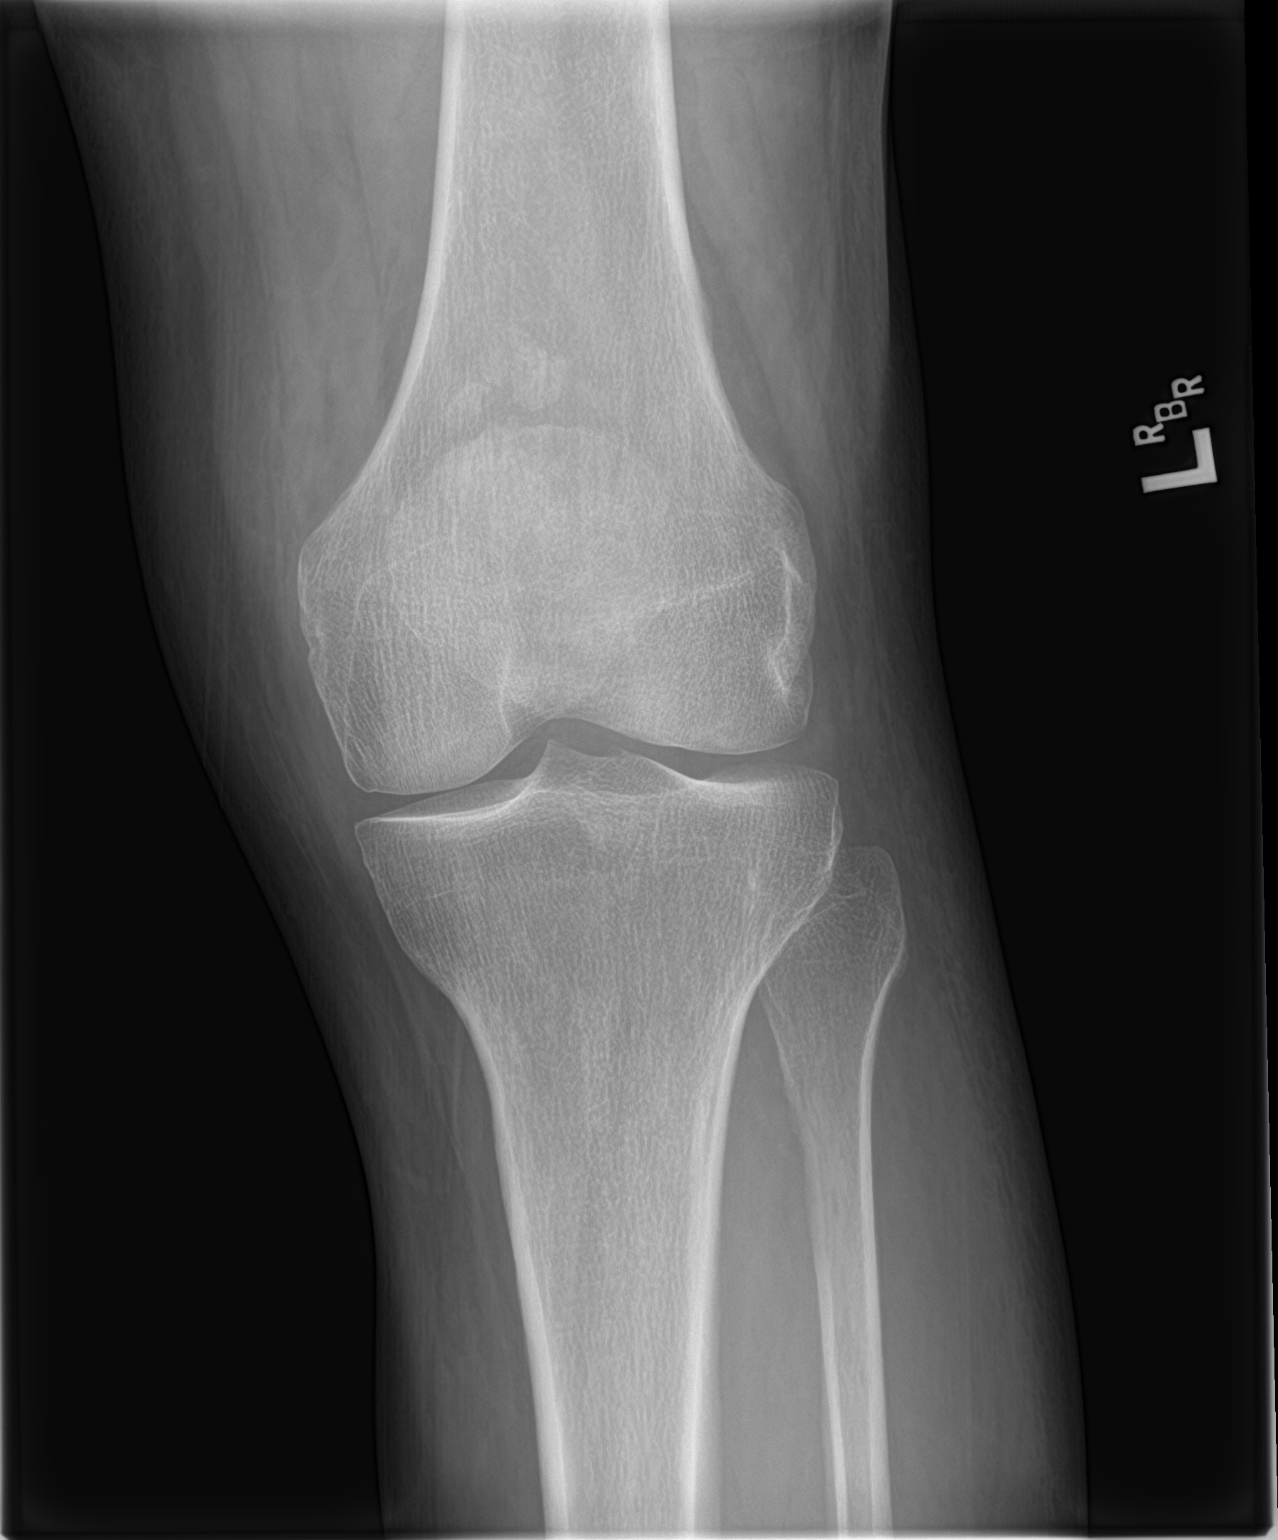

[knee lat]
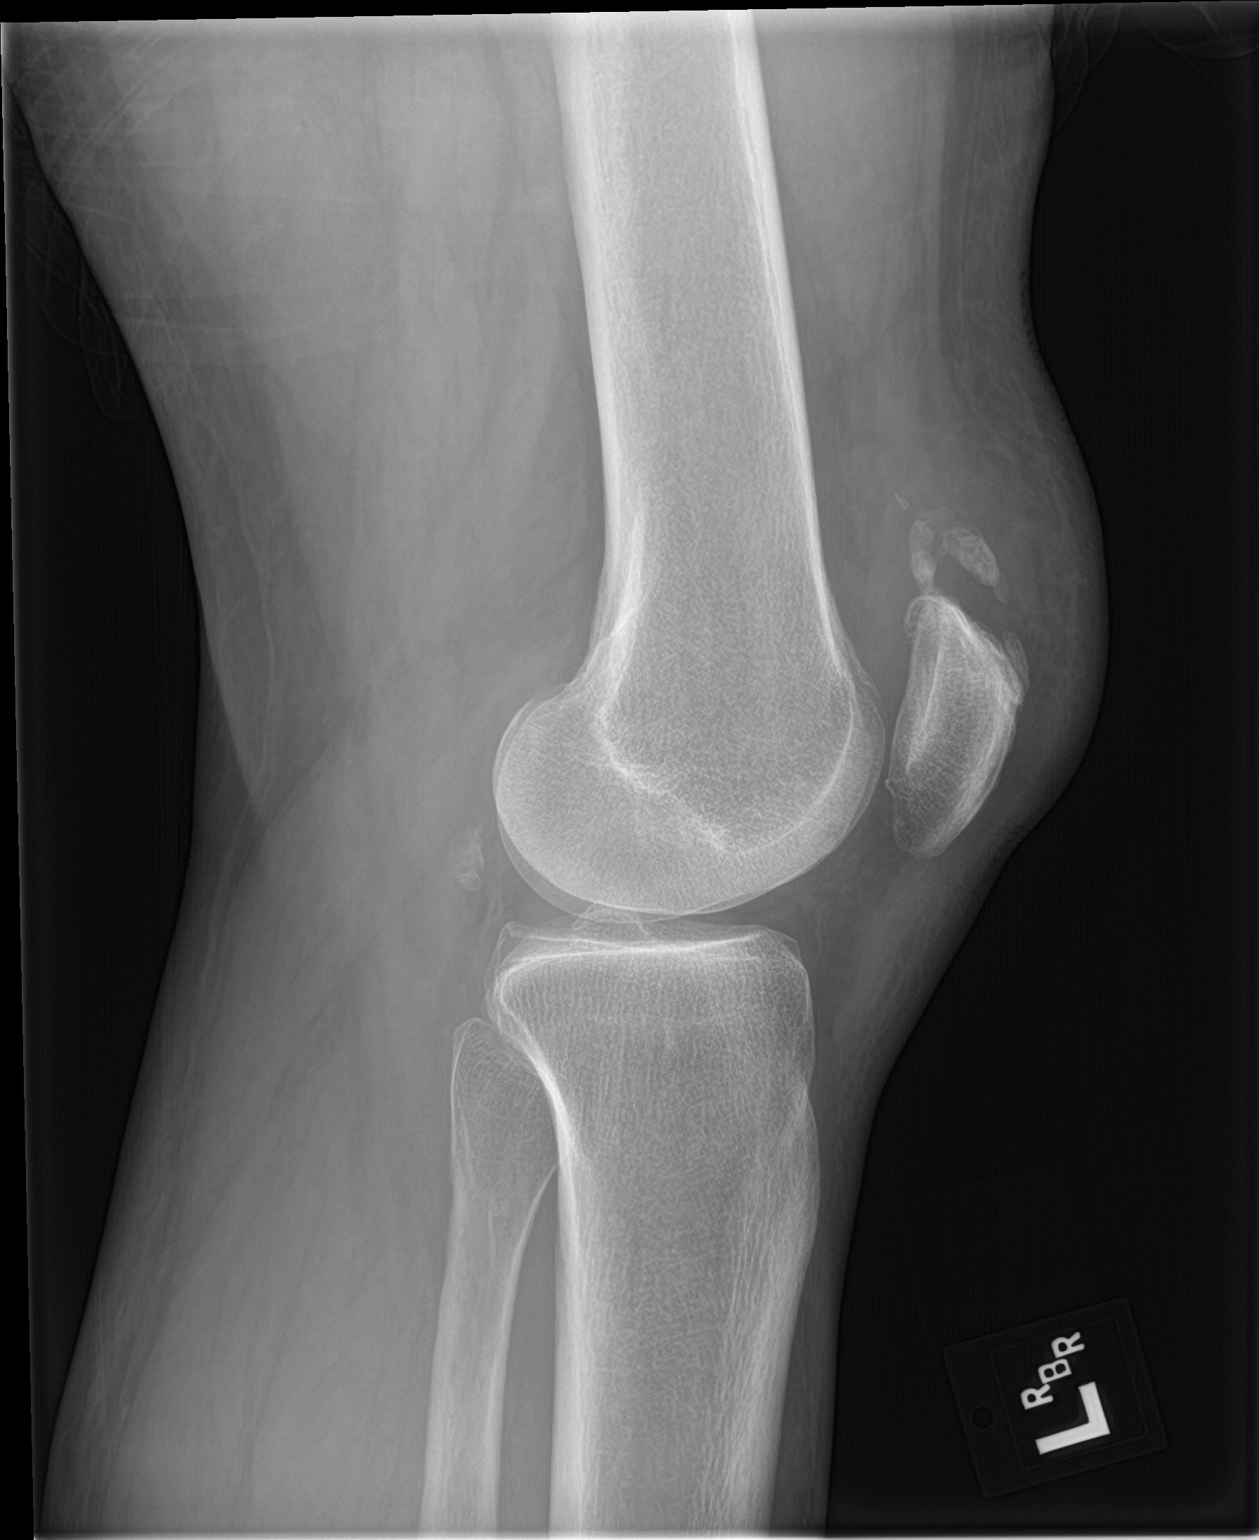

[knee sunrise]
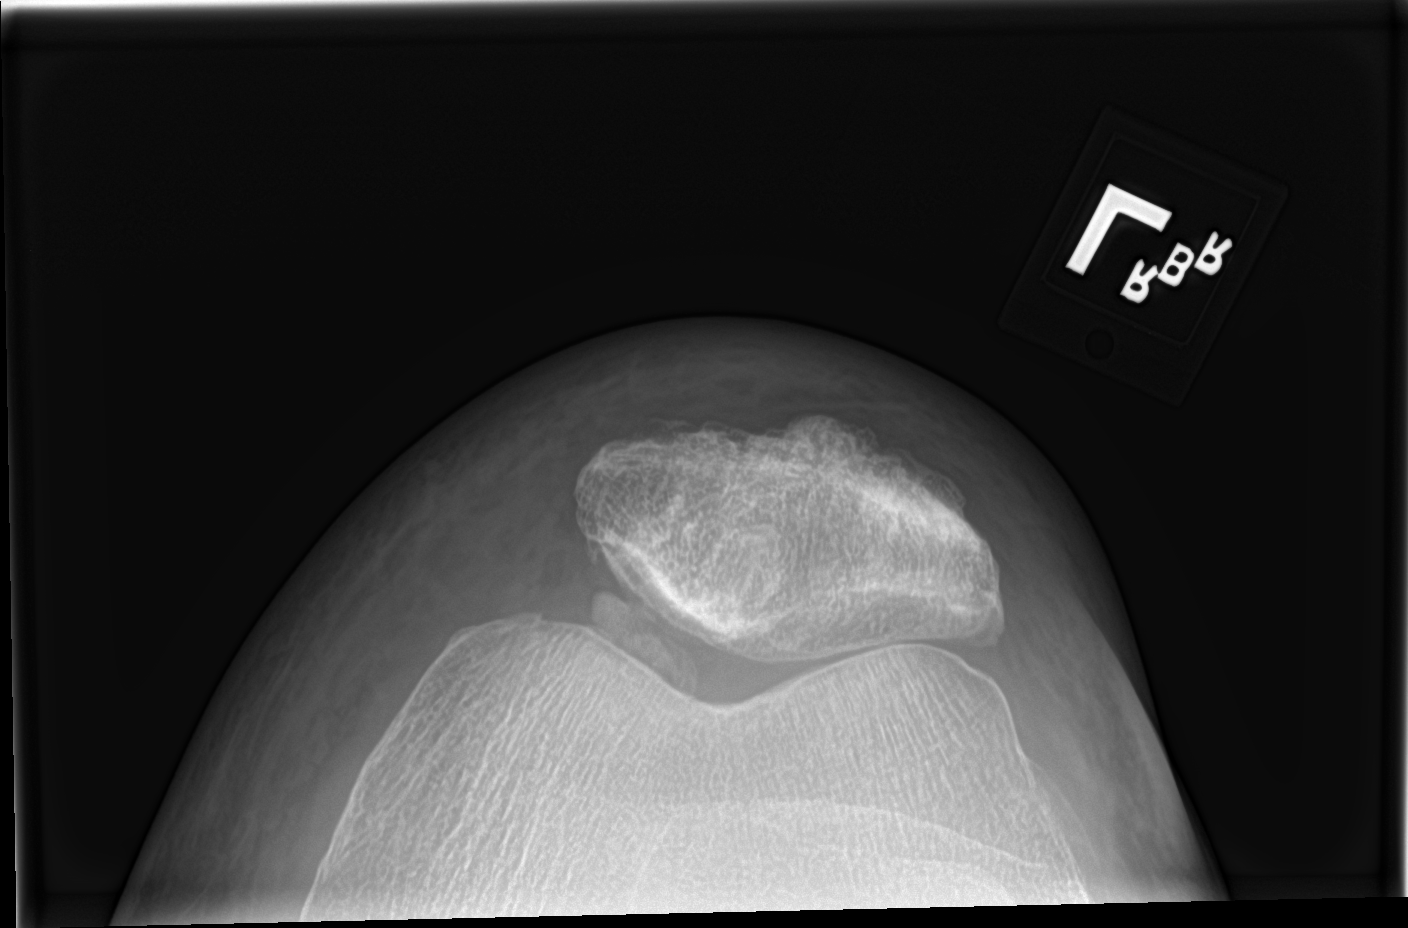

[3 of 3 positions shown; findings below may reference images not displayed]

FINDINGS: Anterior soft tissue swelling. Well corticated bone densities are
noted superior to the patella, likely related to old patellar
avulsion injury. Small to moderate joint effusion. No acute
fracture, subluxation or dislocation.
IMPRESSION: Probable old avulsion off the superior pole of the patella with well
corticated bone fragments. Small joint effusion.

No acute bony abnormality.

Anterior soft tissue swelling.

## 2019-12-29 ENCOUNTER — Telehealth: Payer: Self-pay | Admitting: Medical

## 2019-12-29 MED ORDER — HYDROCHLOROTHIAZIDE 12.5 MG PO TABS: ORAL_TABLET | ORAL | 1 refills | Status: DC

## 2019-12-29 NOTE — Telephone Encounter (Signed)
Medication: hydrochlorothiazide (HYDRODIURIL) 12.5 MG tablet [[id number]]   Patient is out of insurance right now and not able to make an appt due to loosing his job due to the pandemic. He also is requesting for medication assistance so he may be a good candidate for [facility] if Ramon Dredge is able to get a refill. Please advise    Has the patient contacted their pharmacy? No. (If no, request that the patient contact the pharmacy for the refill.) (If yes, when and what did the pharmacy advise?)  Preferred Pharmacy (with phone number or street name):   [id number] [place], [place] - [address] AT [facility] [address]  [address],  [place] [address]  Phone:  [phone] Fax:  [phone] Agent: Please be advised that RX refills may take up to 3 business days. We ask that you follow-up with your pharmacy.

## 2019-12-29 NOTE — Telephone Encounter (Signed)
Medication sent.

## 2022-05-25 ENCOUNTER — Ambulatory Visit (HOSPITAL_COMMUNITY)
Admission: EM | Admit: 2022-05-25 | Discharge: 2022-05-25 | Disposition: A | Discharge status: Home / Self Care | Payer: Medicare Other | Attending: Physician Assistant | Admitting: Physician Assistant

## 2022-05-25 ENCOUNTER — Encounter (HOSPITAL_COMMUNITY): Payer: Self-pay

## 2022-05-25 DIAGNOSIS — M25562 Pain in left knee: Secondary | ICD-10-CM

## 2022-05-25 MED ORDER — KETOROLAC TROMETHAMINE 30 MG/ML IJ SOLN
30.0000 mg | Freq: Once | INTRAMUSCULAR | Status: AC
  Administered 2022-05-25: 30 mg via INTRAMUSCULAR

## 2022-05-25 MED ORDER — PREDNISONE 10 MG (21) PO TBPK: ORAL_TABLET | Freq: Every day | ORAL | 0 refills | Status: DC

## 2022-05-25 MED ORDER — KETOROLAC TROMETHAMINE 30 MG/ML IJ SOLN
INTRAMUSCULAR | Status: AC
  Filled 2022-05-25: qty 1

## 2022-05-25 NOTE — Discharge Instructions (Addendum)
Take prednisone starting tomorrow as prescribed Can continue to wear knee sleeve Apply ice as needed, elevate.  Follow up with ortho or primary care physician if no improvement

## 2022-05-25 NOTE — ED Triage Notes (Signed)
Patient having pain in the left knee. [place] Spaniel he had previous surgery on it 5 years ago.   Onset of pain Wednesday. Random onset not associated with any event. Patient states he can not bend the knee and it was swollen. States keeping the knee straight helps with the pain.

## 2022-05-25 NOTE — ED Provider Notes (Signed)
[facility]    CSN: [id number] Arrival date & time: 05/25/22  1832      History   Chief Complaint Chief Complaint  Patient presents with   Knee Pain    HPI [name] Frank is a 66 y.o. male.   Pt complains of left knee pain.  H/o surgery to this knee in the past.  He denies new injury or trauma. Pain is worse with movement, bending the knee.  He has been wearing a knee sleeve which provides some relief.  He does have a h/o gout.  Denies swelling, redness, warmth.  He has taken nothing for the pain. Pt's job requires him to be on his feet a lot.     Past Medical History:  Diagnosis Date   ACE-inhibitor cough    Fracture of leg 1988   right leg   Hypertension    Low back pain     Patient Active Problem List   Diagnosis Date Noted   Erectile dysfunction 01/21/2015   Visit for preventive health examination 04/25/2014   Traumatic rupture of quadriceps tendon 12/17/2012    Class: Acute   HEMORRHOIDS, EXTERNAL 06/14/2009   DYSLIPIDEMIA 11/07/2007   OBESITY, UNSPECIFIED 11/07/2007   Essential hypertension 11/07/2007    Past Surgical History:  Procedure Laterality Date   LEG SURGERY     RIGHT   QUADRICEPS TENDON REPAIR Left 12/17/2012   Procedure: REPAIR QUADRICEP TENDON;  Surgeon: Eldred Manges, MD;  Location: [facility] OR;  Service: Orthopedics;  Laterality: Left;  Left Quad Tendon Repair       Home Medications    Prior to Admission medications   Medication Sig Start Date End Date Taking? Authorizing Provider  hydrochlorothiazide (HYDRODIURIL) 12.5 MG tablet 1 tab po q day 12/29/19  Yes Saguier, Ramon Dredge, PA-C  predniSONE (STERAPRED UNI-PAK 21 TAB) 10 MG (21) TBPK tablet Take by mouth daily. Take 6 tabs by mouth daily  for 2 days, then 5 tabs for 2 days, then 4 tabs for 2 days, then 3 tabs for 2 days, 2 tabs for 2 days, then 1 tab by mouth daily for 2 days 05/25/22  Yes Ward, Tylene Fantasia, PA-C  colchicine 0.6 MG tablet Take 1 tablet (0.6 mg total) by mouth 2 (two)  times daily. 02/07/18   Saguier, Ramon Dredge, PA-C  diclofenac (VOLTAREN) 75 MG EC tablet Take 1 tablet (75 mg total) by mouth 2 (two) times daily. 02/28/18   Saguier, Ramon Dredge, PA-C  sildenafil (VIAGRA) 100 MG tablet Take 1 tablet (100 mg total) by mouth daily as needed for erectile dysfunction. 09/27/17   Saguier, Ramon Dredge, PA-C  traMADol (ULTRAM) 50 MG tablet Take 1 tablet (50 mg total) by mouth every 6 (six) hours as needed. 05/24/17   Saguier, Ramon Dredge, PA-C    Family History Family History  Problem Relation Age of Onset   Hypertension Mother     Social History Social History   Tobacco Use   Smoking status: Never   Smokeless tobacco: Never  Substance Use Topics   Alcohol use: No   Drug use: No     Allergies   Patient has no known allergies.   Review of Systems Review of Systems  Constitutional:  Negative for chills and fever.  HENT:  Negative for ear pain and sore throat.   Eyes:  Negative for pain and visual disturbance.  Respiratory:  Negative for cough and shortness of breath.   Cardiovascular:  Negative for chest pain and palpitations.  Gastrointestinal:  Negative for  abdominal pain and vomiting.  Genitourinary:  Negative for dysuria and hematuria.  Musculoskeletal:  Positive for arthralgias (knee pain). Negative for back pain.  Skin:  Negative for color change and rash.  Neurological:  Negative for seizures and syncope.  All other systems reviewed and are negative.    Physical Exam Triage Vital Signs ED Triage Vitals  Enc Vitals Group     BP 05/25/22 1918 (!) 184/85     Pulse Rate 05/25/22 1918 74     Resp 05/25/22 1918 16     Temp 05/25/22 1918 98.7 F (37.1 C)     Temp Source 05/25/22 1918 Oral     SpO2 05/25/22 1918 95 %     Weight 05/25/22 1921 220 lb (99.8 kg)     Height 05/25/22 1921 6' (1.829 m)     Head Circumference --      Peak Flow --      Pain Score 05/25/22 1918 6     Pain Loc --      Pain Edu? --      Excl. in GC? --    No data  found.  Updated Vital Signs BP (!) 184/85 (BP Location: Left Arm)   Pulse 74   Temp 98.7 F (37.1 C) (Oral)   Resp 16   Ht 6' (1.829 m)   Wt 220 lb (99.8 kg)   SpO2 95%   BMI 29.84 kg/m   Visual Acuity Right Eye Distance:   Left Eye Distance:   Bilateral Distance:    Right Eye Near:   Left Eye Near:    Bilateral Near:     Physical Exam Vitals and nursing note reviewed.  Constitutional:      General: He is not in acute distress.    Appearance: He is well-developed.  HENT:     Head: Normocephalic and atraumatic.  Eyes:     Conjunctiva/sclera: Conjunctivae normal.  Cardiovascular:     Rate and Rhythm: Normal rate and regular rhythm.     Heart sounds: No murmur heard. Pulmonary:     Effort: Pulmonary effort is normal. No respiratory distress.     Breath sounds: Normal breath sounds.  Abdominal:     Palpations: Abdomen is soft.     Tenderness: There is no abdominal tenderness.  Musculoskeletal:        General: No swelling.     Cervical back: Neck supple.     Comments: Surgical scar to left knee, no warmth, swelling noted.    Skin:    General: Skin is warm and dry.     Capillary Refill: Capillary refill takes less than 2 seconds.  Neurological:     Mental Status: He is alert.  Psychiatric:        Mood and Affect: Mood normal.      UC Treatments / Results  Labs (all labs ordered are listed, but only abnormal results are displayed) Labs Reviewed - No data to display  EKG   Radiology No results found.  Procedures Procedures (including critical care time)  Medications Ordered in UC Medications  ketorolac (TORADOL) 30 MG/ML injection 30 mg (has no administration in time range)    Initial Impression / Assessment and Plan / UC Course  I have reviewed the triage vital signs and the nursing notes.  Pertinent labs & imaging results that were available during my care of the patient were reviewed by me and considered in my medical decision making (see chart  for details).  Left knee pain, no injury or trauma.  No imaging at this time.  No warmth or swelling noted.  Will start prednisone.  He will continue with sleeve and ice.  If no improvement with conservative tx follow up with ortho or PCP.  Final Clinical Impressions(s) / UC Diagnoses   Final diagnoses:  Acute pain of left knee     Discharge Instructions      Take prednisone starting tomorrow as prescribed Can continue to wear knee sleeve Apply ice as needed, elevate.  Follow up with ortho or primary care physician if no improvement    ED Prescriptions     Medication Sig Dispense Auth. Provider   predniSONE (STERAPRED UNI-PAK 21 TAB) 10 MG (21) TBPK tablet Take by mouth daily. Take 6 tabs by mouth daily  for 2 days, then 5 tabs for 2 days, then 4 tabs for 2 days, then 3 tabs for 2 days, 2 tabs for 2 days, then 1 tab by mouth daily for 2 days 42 tablet Ward, Tylene Fantasia, PA-C      PDMP not reviewed this encounter.   Ward, Tylene Fantasia, PA-C 05/25/22 1941

## 2022-05-26 ENCOUNTER — Other Ambulatory Visit (HOSPITAL_COMMUNITY): Payer: Self-pay | Admitting: Emergency Medicine

## 2022-05-26 MED ORDER — PREDNISONE 10 MG (21) PO TBPK: ORAL_TABLET | Freq: Every day | ORAL | 0 refills | Status: DC

## 2022-05-30 ENCOUNTER — Encounter (HOSPITAL_COMMUNITY): Payer: Self-pay

## 2022-05-30 ENCOUNTER — Ambulatory Visit (HOSPITAL_COMMUNITY)
Admission: EM | Admit: 2022-05-30 | Discharge: 2022-05-30 | Disposition: A | Discharge status: Home / Self Care | Payer: Medicare Other | Attending: Internal Medicine | Admitting: Internal Medicine

## 2022-05-30 ENCOUNTER — Ambulatory Visit (INDEPENDENT_AMBULATORY_CARE_PROVIDER_SITE_OTHER): Payer: Medicare Other

## 2022-05-30 DIAGNOSIS — M10462 Other secondary gout, left knee: Secondary | ICD-10-CM | POA: Insufficient documentation

## 2022-05-30 DIAGNOSIS — M25562 Pain in left knee: Secondary | ICD-10-CM

## 2022-05-30 LAB — URIC ACID: Uric Acid, Serum: 7.6 mg/dL (ref 3.7–8.6)

## 2022-05-30 MED ORDER — INDOMETHACIN 50 MG PO CAPS: 50.0000 mg | ORAL_CAPSULE | Freq: Three times a day (TID) | ORAL | 0 refills | Status: AC

## 2022-05-30 NOTE — Discharge Instructions (Addendum)
You have a gout flare in your L knee Continue prednisone If not better after finishing prednisone, try the indomethacin  Avoid alcohol and red meats Follow up with primary in 1-2 weeks if not better

## 2022-05-30 NOTE — ED Provider Notes (Signed)
[facility]   [id number] 05/30/22 Arrival Time: 73  ASSESSMENT & PLAN:  1. Other secondary acute gout of left knee    -History and exam consistent with gout flare of left knee.  Low clinical suspicion for septic joint.  This is his first gout flare in the knee.  I will get a serum uric acid level to help confirm the diagnosis.  Due to his lack of improvement on prednisone over the last 5 days, I wrote a new prescription for indomethacin.  I advised him to complete prednisone and if no improvement then to try the indomethacin.  Avoid alcohol and red meats.  Continue icing and elevation.  If no improvement, I recommend he follow-up with his primary and would consider joint aspiration at that time.  Meds ordered this encounter  Medications   indomethacin (INDOCIN) 50 MG capsule    Sig: Take 1 capsule (50 mg total) by mouth 3 (three) times daily with meals for 7 days.    Dispense:  21 capsule    Refill:  0     Discharge Instructions      You have a gout flare in your L knee Continue prednisone If not better after finishing prednisone, try the indomethacin  Avoid alcohol and red meats Follow up with primary in 1-2 weeks if not better     Follow-up Information     Saguier, Percell Miller, PA-C.   Specialties: Internal Medicine, Family Medicine Why: If symptoms worsen Contact information: [address] [place] [address] [phone]                  Reviewed expectations re: course of current medical issues. Questions answered. Outlined signs and symptoms indicating need for more acute intervention. Patient verbalized understanding. After Visit Summary given.   SUBJECTIVE: Pleasant 66 year old male who presents for evaluation of left knee pain.  He was previously seen here on Friday for same complaint.  At that time he was given a Toradol injection and started on prednisone.  Today he reports worsening pain and swelling in the left knee.  He  has been taking the prednisone as prescribed.  He is icing and elevating it.  Unfortunately he is still unable to bend the knee.  He denies any redness of the knee.  He denies any trauma to the knee.  Of note, he does have a history of left quad tendon repair but there is no hardware in the knee.  He denies any systemic symptoms such as fevers, chills, nausea or vomiting.  Of note, he does have history of gout.  Says gout usually affects his toes.  He is unsure if he is ever had gout in his knee.  He does have a elevated uric acid level from 2019.  No LMP for male patient. Past Surgical History:  Procedure Laterality Date   LEG SURGERY     RIGHT   QUADRICEPS TENDON REPAIR Left 12/17/2012   Procedure: REPAIR QUADRICEP TENDON;  Surgeon: Marybelle Killings, MD;  Location: [facility];  Service: Orthopedics;  Laterality: Left;  Left Quad Tendon Repair     OBJECTIVE:  Vitals:   05/30/22 1108  BP: (!) 186/94  Pulse: (!) 106  Resp: 18  Temp: 98.2 F (36.8 C)  SpO2: 97%     Physical Exam Vitals reviewed. Nursing note reviewed: Elevated blood pressure secondary to pain. Constitutional:      General: He is not in acute distress.    Appearance: He  is not toxic-appearing.  Cardiovascular:     Rate and Rhythm: Tachycardia present.  Pulmonary:     Effort: Pulmonary effort is normal.  Musculoskeletal:     Comments: L Knee -hold midline scar well approximated.  Joint effusion present.  No erythema or ecchymoses. The joint is warm.  Nontender to palpation medial or lateral joint line.  Range of motion is limited by pain and swelling.  Neurovascular intact distally  Neurological:     Mental Status: He is alert.      Labs: Results for orders placed or performed in visit on 02/06/18  CBC w/Diff  Result Value Ref Range   WBC 10.1 4.0 - 10.5 K/uL   RBC 4.92 4.22 - 5.81 Mil/uL   Hemoglobin 14.4 13.0 - 17.0 g/dL   HCT 41.9 39.0 - 52.0 %   MCV 85.1 78.0 - 100.0 fl   MCHC 34.4 30.0 - 36.0 g/dL   RDW  14.1 11.5 - 15.5 %   Platelets 199.0 150.0 - 400.0 K/uL   Neutrophils Relative % 75.2 43.0 - 77.0 %   Lymphocytes Relative 18.4 12.0 - 46.0 %   Monocytes Relative 5.4 3.0 - 12.0 %   Eosinophils Relative 0.7 0.0 - 5.0 %   Basophils Relative 0.3 0.0 - 3.0 %   Neutro Abs 7.6 1.4 - 7.7 K/uL   Lymphs Abs 1.9 0.7 - 4.0 K/uL   Monocytes Absolute 0.5 0.1 - 1.0 K/uL   Eosinophils Absolute 0.1 0.0 - 0.7 K/uL   Basophils Absolute 0.0 0.0 - 0.1 K/uL  Uric acid  Result Value Ref Range   Uric Acid, Serum 7.9 (H) 4.0 - 7.8 mg/dL  Comp Met (CMET)  Result Value Ref Range   Sodium 141 135 - 145 mEq/L   Potassium 4.8 3.5 - 5.1 mEq/L   Chloride 104 96 - 112 mEq/L   CO2 31 19 - 32 mEq/L   Glucose, Bld 99 70 - 99 mg/dL   BUN 11 6 - 23 mg/dL   Creatinine, Ser 1.27 0.40 - 1.50 mg/dL   Total Bilirubin 0.6 0.2 - 1.2 mg/dL   Alkaline Phosphatase 74 39 - 117 U/L   AST 22 0 - 37 U/L   ALT 19 0 - 53 U/L   Total Protein 7.6 6.0 - 8.3 g/dL   Albumin 4.2 3.5 - 5.2 g/dL   Calcium 9.4 8.4 - 10.5 mg/dL   GFR 74.10 >60.00 mL/min   Labs Reviewed  URIC ACID    Imaging: DG Knee Complete 4 Views Left  Result Date: 05/30/2022 CLINICAL DATA:  Left knee pain and swelling without known injury. EXAM: LEFT KNEE - COMPLETE 4+ VIEW COMPARISON:  Feb 06, 2018. FINDINGS: No evidence of acute fracture or dislocation. Possible small suprapatellar joint effusion is noted. No evidence of arthropathy. Mild patellar spurring is noted. Soft tissues are unremarkable. IMPRESSION: Possible small suprapatellar joint effusion. No fracture or dislocation is noted. Electronically Signed   By: Marijo Conception M.D.   On: 05/30/2022 11:44     No Known Allergies                                             Past Medical History:  Diagnosis Date   ACE-inhibitor cough    Fracture of leg 1988   right leg   Hypertension    Low back pain  Social History   Socioeconomic History   Marital status: Married    Spouse name: Not on file    Number of children: Not on file   Years of education: Not on file   Highest education level: Not on file  Occupational History   Not on file  Tobacco Use   Smoking status: Never   Smokeless tobacco: Never  Substance and Sexual Activity   Alcohol use: No   Drug use: No   Sexual activity: Not on file  Other Topics Concern   Not on file  Social History Narrative   Occupation:  Administrator  Network engineer)   Married  3 grown children   Never Smoked   Alcohol use-no   Drug use-no       Smoking Status:  quit   Caffeine use/day:  NONE   Does Patient Exercise:  yes   Social Determinants of Radio broadcast assistant Strain: Not on file  Food Insecurity: Not on file  Transportation Needs: Not on file  Physical Activity: Not on file  Stress: Not on file  Social Connections: Not on file  Intimate Partner Violence: Not on file    Family History  Problem Relation Age of Onset   Hypertension Mother       Dortha Kern, MD 05/30/22 1233

## 2022-05-30 NOTE — ED Triage Notes (Signed)
Pt states seen and tx'd here on Friday for lt knee swelling and pain. States has appt with PCP on 9/1, pain is worse. Currently on prednisone with no relief. States unable to bare weight today. Denies injury.

## 2022-06-04 ENCOUNTER — Telehealth: Payer: Self-pay | Admitting: *Deleted

## 2022-06-04 NOTE — Telephone Encounter (Signed)
Ok for pt to re-establish on 06/08/2022

## 2022-06-04 NOTE — Telephone Encounter (Signed)
Is care re-establishment ok for this pt?

## 2022-06-05 NOTE — Telephone Encounter (Signed)
Pt called and lvm to return call 

## 2022-06-08 ENCOUNTER — Telehealth: Payer: Self-pay | Admitting: Medical

## 2022-06-08 ENCOUNTER — Encounter: Payer: Self-pay | Admitting: Medical

## 2022-06-08 ENCOUNTER — Other Ambulatory Visit (HOSPITAL_COMMUNITY): Payer: Self-pay

## 2022-06-08 ENCOUNTER — Other Ambulatory Visit: Payer: Self-pay | Admitting: Medical

## 2022-06-08 ENCOUNTER — Other Ambulatory Visit (HOSPITAL_BASED_OUTPATIENT_CLINIC_OR_DEPARTMENT_OTHER): Payer: Self-pay

## 2022-06-08 ENCOUNTER — Ambulatory Visit (INDEPENDENT_AMBULATORY_CARE_PROVIDER_SITE_OTHER): Payer: Medicare Other | Admitting: Medical

## 2022-06-08 VITALS — BP 140/80 | HR 90 | Temp 98.0°F | Resp 18 | Ht 72.0 in | Wt 208.8 lb

## 2022-06-08 DIAGNOSIS — N529 Male erectile dysfunction, unspecified: Secondary | ICD-10-CM

## 2022-06-08 DIAGNOSIS — M2550 Pain in unspecified joint: Secondary | ICD-10-CM | POA: Diagnosis not present

## 2022-06-08 DIAGNOSIS — I10 Essential (primary) hypertension: Secondary | ICD-10-CM

## 2022-06-08 DIAGNOSIS — Z1211 Encounter for screening for malignant neoplasm of colon: Secondary | ICD-10-CM | POA: Diagnosis not present

## 2022-06-08 DIAGNOSIS — Z23 Encounter for immunization: Secondary | ICD-10-CM

## 2022-06-08 DIAGNOSIS — R351 Nocturia: Secondary | ICD-10-CM

## 2022-06-08 DIAGNOSIS — M1000 Idiopathic gout, unspecified site: Secondary | ICD-10-CM

## 2022-06-08 LAB — LIPID PANEL
Cholesterol: 140 mg/dL (ref 0–200)
HDL: 39.2 mg/dL (ref >39.00)
NonHDL: 100.87
Total CHOL/HDL Ratio: 4
Triglycerides: 251 mg/dL — ABNORMAL HIGH (ref 0.0–149.0)
VLDL: 50.2 mg/dL — ABNORMAL HIGH (ref 0.0–40.0)

## 2022-06-08 LAB — COMPREHENSIVE METABOLIC PANEL
ALT: 21 U/L (ref 0–53)
AST: 14 U/L (ref 0–37)
Albumin: 3.6 g/dL (ref 3.5–5.2)
Alkaline Phosphatase: 83 U/L (ref 39–117)
BUN: 9 mg/dL (ref 6–23)
CO2: 30 mEq/L (ref 19–32)
Calcium: 8.9 mg/dL (ref 8.4–10.5)
Chloride: 103 mEq/L (ref 96–112)
Creatinine, Ser: 1.28 mg/dL (ref 0.40–1.50)
GFR: 58.73 mL/min — ABNORMAL LOW (ref >60.00)
Glucose, Bld: 89 mg/dL (ref 70–99)
Potassium: 4.2 mEq/L (ref 3.5–5.1)
Sodium: 140 mEq/L (ref 135–145)
Total Bilirubin: 0.4 mg/dL (ref 0.2–1.2)
Total Protein: 6.8 g/dL (ref 6.0–8.3)

## 2022-06-08 LAB — CBC WITH DIFFERENTIAL/PLATELET
Basophils Absolute: 0 10*3/uL (ref 0.0–0.1)
Basophils Relative: 0.2 % (ref 0.0–3.0)
Eosinophils Absolute: 0.1 10*3/uL (ref 0.0–0.7)
Eosinophils Relative: 0.7 % (ref 0.0–5.0)
HCT: 43.1 % (ref 39.0–52.0)
Hemoglobin: 14.4 g/dL (ref 13.0–17.0)
Lymphocytes Relative: 19.9 % (ref 12.0–46.0)
Lymphs Abs: 2.2 10*3/uL (ref 0.7–4.0)
MCHC: 33.4 g/dL (ref 30.0–36.0)
MCV: 86.8 fl (ref 78.0–100.0)
Monocytes Absolute: 0.7 10*3/uL (ref 0.1–1.0)
Monocytes Relative: 6.5 % (ref 3.0–12.0)
Neutro Abs: 7.9 10*3/uL — ABNORMAL HIGH (ref 1.4–7.7)
Neutrophils Relative %: 72.7 % (ref 43.0–77.0)
Platelets: 184 10*3/uL (ref 150.0–400.0)
RBC: 4.96 Mil/uL (ref 4.22–5.81)
RDW: 14.2 % (ref 11.5–15.5)
WBC: 10.9 10*3/uL — ABNORMAL HIGH (ref 4.0–10.5)

## 2022-06-08 LAB — LDL CHOLESTEROL, DIRECT: Direct LDL: 70 mg/dL

## 2022-06-08 LAB — PSA: PSA: 0.32 ng/mL (ref 0.10–4.00)

## 2022-06-08 MED ORDER — LOSARTAN POTASSIUM 50 MG PO TABS
50.0000 mg | ORAL_TABLET | Freq: Every day | ORAL | 0 refills | Status: DC
  Filled 2022-06-08: qty 30, 30d supply, fill #0

## 2022-06-08 MED ORDER — LOSARTAN POTASSIUM 50 MG PO TABS: 50.0000 mg | ORAL_TABLET | Freq: Every day | ORAL | 0 refills | Status: DC

## 2022-06-08 NOTE — Addendum Note (Signed)
Addended by: Gwenevere Abbot on: 06/08/2022 03:17 PM   Modules accepted: Orders

## 2022-06-08 NOTE — Telephone Encounter (Signed)
Patient states the losartan cost too much and would like a new medication sent instead. He also stated that the old medication he used to be on would also work. Please advise.

## 2022-06-08 NOTE — Addendum Note (Signed)
Addended by: Kathi Ludwig on: 06/08/2022 12:50 PM   Modules accepted: Orders

## 2022-06-08 NOTE — Progress Notes (Signed)
Subjective:    Patient ID: [name] Frank, male    DOB: Jan 17, 1956, 66 y.o.   MRN: [id number]  HPI  Pt in to get reestablished after more than 3 years.  Pt retired now after being layed off from [employer]. He states eating healthy. Nonsmoker. No alcohol. Does drink sweet tea. Exercises once a week.  Pt has htn. He has been using his wife hctz 12.5 mg daily occasionally but none recently in about one week.   He also has gout. One week ago had left knee pain. He was given prednisone and indocin when he went to ED. Pt know has good range of motion and just states mild sore now.   On review he had negative colonoscopy at 66 years of age per pt.  Pt willing to get pcv 20 and flu vaccine today.       Review of Systems  Constitutional:  Positive for chills. Negative for fatigue and fever.  HENT:  Negative for congestion.   Respiratory:  Negative for cough, chest tightness, shortness of breath and wheezing.   Cardiovascular:  Negative for chest pain and palpitations.  Gastrointestinal:  Negative for abdominal pain, constipation and nausea.  Genitourinary:  Positive for frequency. Negative for difficulty urinating, enuresis, flank pain, hematuria, penile pain, testicular pain and urgency.       Some increased frequency at night.   ED for 2 years. Failed viagra. Gave him HA as well.  Musculoskeletal:  Negative for back pain and myalgias.       Residual knee pain left side.  Skin:  Negative for rash.  Neurological:  Negative for dizziness and light-headedness.  Hematological:  Negative for adenopathy. Does not bruise/bleed easily.  Psychiatric/Behavioral:  Negative for behavioral problems.     Past Medical History:  Diagnosis Date   ACE-inhibitor cough    Fracture of leg 10/08/1986   right leg   Gout    Hypertension    Low back pain      Social History   Socioeconomic History   Marital status: Married    Spouse name: Not on file   Number of children: Not on file    Years of education: Not on file   Highest education level: Not on file  Occupational History   Not on file  Tobacco Use   Smoking status: Never   Smokeless tobacco: Never  Substance and Sexual Activity   Alcohol use: No   Drug use: No   Sexual activity: Not on file  Other Topics Concern   Not on file  Social History Narrative   Occupation:  Administrator  Network engineer)   Married  3 grown children   Never Smoked   Alcohol use-no   Drug use-no       Smoking Status:  quit   Caffeine use/day:  NONE   Does Patient Exercise:  yes   Social Determinants of Radio broadcast assistant Strain: Not on file  Food Insecurity: Not on file  Transportation Needs: Not on file  Physical Activity: Not on file  Stress: Not on file  Social Connections: Not on file  Intimate Partner Violence: Not on file    Past Surgical History:  Procedure Laterality Date   LEG SURGERY     RIGHT   QUADRICEPS TENDON REPAIR Left 12/17/2012   Procedure: REPAIR QUADRICEP TENDON;  Surgeon: Marybelle Killings, MD;  Location: [facility];  Service: Orthopedics;  Laterality: Left;  Left Quad Tendon Repair  Family History  Problem Relation Age of Onset   Hypertension Mother     No Known Allergies  Current Outpatient Medications on File Prior to Visit  Medication Sig Dispense Refill   colchicine 0.6 MG tablet Take 1 tablet (0.6 mg total) by mouth 2 (two) times daily. 60 tablet 2   diclofenac (VOLTAREN) 75 MG EC tablet Take 1 tablet (75 mg total) by mouth 2 (two) times daily. 20 tablet 0   hydrochlorothiazide (HYDRODIURIL) 12.5 MG tablet 1 tab po q day 90 tablet 1   sildenafil (VIAGRA) 100 MG tablet Take 1 tablet (100 mg total) by mouth daily as needed for erectile dysfunction. 10 tablet 0   traMADol (ULTRAM) 50 MG tablet Take 1 tablet (50 mg total) by mouth every 6 (six) hours as needed. 12 tablet 0   No current facility-administered medications on file prior to visit.    BP (!) 140/80   Pulse 90   Temp 98 F  (36.7 C)   Resp 18   Ht 6' (1.829 m)   Wt 208 lb 12.8 oz (94.7 kg)   SpO2 100%   BMI 28.32 kg/m        Objective:   Physical Exam   General Mental Status- Alert. General Appearance- Not in acute distress.   Skin General: Color- Normal Color. Moisture- Normal Moisture.   Chest and Lung Exam Auscultation: Breath Sounds:-Normal.  Cardiovascular Auscultation:Rythm- Regular. Murmurs & Other Heart Sounds:Auscultation of the heart reveals- No Murmurs.  Abdomen Inspection:-Inspeection Normal. Palpation/Percussion:Note:No mass. Palpation and Percussion of the abdomen reveal- Non Tender, Non Distended + BS, no rebound or guarding.   Neurologic Cranial Nerve exam:- CN III-XII intact(No nystagmus), symmetric smile. Strength:- 5/5 equal and symmetric strength both upper and lower extremities.   Left knee- good rom. No swelling. Faint slight warmth at best.     Assessment & Plan:   Patient Instructions  Htn- bp mild high today. Will prescribe losartan 50 mg daily. Decided againt hctz in light of recent gout flares. Get cmp and lipid panel today as well.  Gout much better/less pain now. Will get cmp today. After review may rx colchicine.  For frequent urination. Check psa lab.  Counseled on ED.   Pneumonia vaccine and flu vaccine today.  Referred for screening colonoscopy. Recommend calling GI MD office.   Follow up 2 weeks for in office bp check with me. Sooner if needed.   Mackie Pai, PA-C

## 2022-06-08 NOTE — Telephone Encounter (Signed)
Called pt he stated he was in [place] too far to drive back up here , sent script to [facility] pt stated he would could go get the script

## 2022-06-08 NOTE — Patient Instructions (Addendum)
Htn- bp mild high today. Will prescribe losartan 50 mg daily. Decided againt hctz in light of recent gout flares. Get cmp and lipid panel today as well.  Gout much better/less pain now. Will get cmp today. After review may rx colchicine.  For frequent urination. Check psa lab.  Counseled on ED.   Pneumonia vaccine and flu vaccine today.  Referred for screening colonoscopy. Recommend calling GI MD office.   Follow up 2 weeks for in office bp check with me. Sooner if needed.

## 2022-06-08 NOTE — Telephone Encounter (Signed)
Patient stated his gout medication also needs to be sent to the [facility] pharmacy. Please advise.

## 2022-06-09 MED ORDER — COLCHICINE 0.6 MG PO TABS
0.6000 mg | ORAL_TABLET | Freq: Every day | ORAL | 3 refills | Status: DC
  Filled 2022-06-09: qty 30, 30d supply, fill #0

## 2022-06-09 MED ORDER — COLCHICINE 0.6 MG PO TABS
0.6000 mg | ORAL_TABLET | Freq: Every day | ORAL | 3 refills | Status: DC
  Filled 2022-06-09: qty 60, 60d supply, fill #0

## 2022-06-09 MED ORDER — ATORVASTATIN CALCIUM 10 MG PO TABS
ORAL_TABLET | ORAL | 3 refills | Status: DC
  Filled 2022-06-09 – 2022-06-18 (×3): qty 15, 30d supply, fill #0

## 2022-06-09 NOTE — Addendum Note (Signed)
Addended by: Gwenevere Abbot on: 06/09/2022 09:16 AM   Modules accepted: Orders

## 2022-06-12 ENCOUNTER — Other Ambulatory Visit (HOSPITAL_BASED_OUTPATIENT_CLINIC_OR_DEPARTMENT_OTHER): Payer: Self-pay

## 2022-06-12 ENCOUNTER — Other Ambulatory Visit (HOSPITAL_COMMUNITY): Payer: Self-pay

## 2022-06-12 MED ORDER — COLCHICINE 0.6 MG PO TABS
0.6000 mg | ORAL_TABLET | Freq: Every day | ORAL | 3 refills | Status: DC
  Filled 2022-06-12 – 2022-06-18 (×2): qty 60, 60d supply, fill #0

## 2022-06-12 MED ORDER — SILDENAFIL CITRATE 100 MG PO TABS
100.0000 mg | ORAL_TABLET | Freq: Every day | ORAL | 0 refills | Status: AC | PRN
  Filled 2022-06-12: qty 10, 10d supply, fill #0

## 2022-06-12 NOTE — Telephone Encounter (Signed)
Rx sent 

## 2022-06-12 NOTE — Addendum Note (Signed)
Addended by: Maximino Sarin on: 06/12/2022 08:38 AM   Modules accepted: Orders

## 2022-06-18 ENCOUNTER — Other Ambulatory Visit (HOSPITAL_COMMUNITY): Payer: Self-pay

## 2022-06-18 ENCOUNTER — Other Ambulatory Visit (HOSPITAL_BASED_OUTPATIENT_CLINIC_OR_DEPARTMENT_OTHER): Payer: Self-pay

## 2022-06-22 ENCOUNTER — Ambulatory Visit: Payer: Medicare Other | Admitting: Medical

## 2022-06-29 ENCOUNTER — Encounter: Payer: Self-pay | Admitting: Medical

## 2022-06-29 ENCOUNTER — Ambulatory Visit (INDEPENDENT_AMBULATORY_CARE_PROVIDER_SITE_OTHER): Payer: Medicare Other | Admitting: Medical

## 2022-06-29 ENCOUNTER — Other Ambulatory Visit (HOSPITAL_COMMUNITY): Payer: Self-pay

## 2022-06-29 ENCOUNTER — Ambulatory Visit (HOSPITAL_BASED_OUTPATIENT_CLINIC_OR_DEPARTMENT_OTHER)
Admission: RE | Admit: 2022-06-29 | Discharge: 2022-06-29 | Disposition: A | Discharge status: Home / Self Care | Payer: Medicare Other | Source: Ambulatory Visit | Attending: Medical | Admitting: Medical

## 2022-06-29 VITALS — BP 140/80 | HR 71 | Temp 97.4°F | Ht 72.0 in | Wt 202.4 lb

## 2022-06-29 DIAGNOSIS — M1000 Idiopathic gout, unspecified site: Secondary | ICD-10-CM

## 2022-06-29 DIAGNOSIS — R062 Wheezing: Secondary | ICD-10-CM | POA: Diagnosis not present

## 2022-06-29 DIAGNOSIS — E785 Hyperlipidemia, unspecified: Secondary | ICD-10-CM | POA: Diagnosis not present

## 2022-06-29 DIAGNOSIS — I10 Essential (primary) hypertension: Secondary | ICD-10-CM

## 2022-06-29 MED ORDER — AMLODIPINE BESYLATE 5 MG PO TABS
5.0000 mg | ORAL_TABLET | Freq: Every day | ORAL | 3 refills | Status: DC
  Filled 2022-06-29: qty 90, 90d supply, fill #0

## 2022-06-29 MED ORDER — COLCHICINE 0.6 MG PO TABS
0.6000 mg | ORAL_TABLET | Freq: Every day | ORAL | 3 refills | Status: DC
  Filled 2022-06-29: qty 90, 90d supply, fill #0

## 2022-06-29 MED ORDER — LOSARTAN POTASSIUM 50 MG PO TABS
50.0000 mg | ORAL_TABLET | Freq: Every day | ORAL | 3 refills | Status: DC
  Filled 2022-06-29: qty 90, 90d supply, fill #0

## 2022-06-29 MED ORDER — ATORVASTATIN CALCIUM 10 MG PO TABS
10.0000 mg | ORAL_TABLET | ORAL | 3 refills | Status: DC
  Filled 2022-06-29: qty 45, fill #0

## 2022-06-29 NOTE — Progress Notes (Signed)
   Subjective:    Patient ID: [name] Frank, male    DOB: 27-Nov-1955, 66 y.o.   MRN: [id number]  HPI Pt in for follow up. His left knee gout pain almost 100% resolved. Pt is on colchine. Currently taking one tab a day.  Pt has htn. Last visit gave losaatan 50 mg . Took of hctz as did not want to increase chance for gout flare with hctz.    The 10-year ASCVD risk score (Arnett DK, et al., 2019) is: 16.9%   Values used to calculate the score:     Age: 89 years     Sex: Male     Is Non-Hispanic African American: Yes     Diabetic: No     Tobacco smoker: No     Systolic Blood Pressure: 093 mmHg     Is BP treated: Yes     HDL Cholesterol: 39.2 mg/dL     Total Cholesterol: 140 mg/dL    Lipid panel showed elevated level. Med interaction with colchine. So gave reduced dose atorvastin three times weekly.    Nonsmoker presently. Only smoked for 5 years in past. No cough or fever.  Review of Systems  Constitutional:  Negative for chills and fatigue.  Respiratory:  Negative for cough, chest tightness and wheezing.        Wheeze on exam  Cardiovascular:  Negative for chest pain and palpitations.  Gastrointestinal:  Negative for abdominal pain.  Genitourinary:  Negative for dysuria, flank pain, frequency, genital sores and hematuria.  Musculoskeletal:        Left knee pain resolved.       Objective:   Physical Exam  General Mental Status- Alert. General Appearance- Not in acute distress.   Skin General: Color- Normal Color. Moisture- Normal Moisture.  Neck Carotid Arteries- Normal color. Moisture- Normal Moisture. No carotid bruits. No JVD.  Chest and Lung Exam Auscultation: Breath Sounds:- even  unlabored. But mild wheeze upper lobe. Mild shallow breathing bilaterallyu.  Cardiovascular Auscultation:Rythm- Regular. Murmurs & Other Heart Sounds:Auscultation of the heart reveals- No Murmurs.  Abdomen Inspection:-Inspeection Normal. Palpation/Percussion:Note:No mass.  Palpation and Percussion of the abdomen reveal- Non Tender, Non Distended + BS, no rebound or guarding.   Neurologic Cranial Nerve exam:- CN III-XII intact(No nystagmus), symmetric smile. Strength:- 5/5 equal and symmetric strength both upper and lower extremities.       Assessment & Plan:   Patient Instructions  Htn- bp is not ideal. Continue losartan 50 mg daily and add on amlodipine 5 mg daily.  For gout improved continue colchicine daily.  For high cholesterol continue atorvastatin 10 mg 3 times weekly.  For mild wheeze get cxr today.  Follow up in 3 month or sooner if needed.

## 2022-06-29 NOTE — Patient Instructions (Addendum)
Htn- bp is not ideal. Continue losartan 50 mg daily and add on amlodipine 5 mg daily.  For gout improved continue colchicine daily.  For high cholesterol continue atorvastatin 10 mg 3 times weekly.  For mild wheeze get cxr today.  Follow up in 3 month or sooner if needed.  Send me bp update in 10 days.

## 2022-07-02 ENCOUNTER — Other Ambulatory Visit (HOSPITAL_COMMUNITY): Payer: Self-pay

## 2022-07-02 ENCOUNTER — Other Ambulatory Visit (HOSPITAL_BASED_OUTPATIENT_CLINIC_OR_DEPARTMENT_OTHER): Payer: Self-pay

## 2022-07-26 ENCOUNTER — Ambulatory Visit: Payer: BLUE CROSS/BLUE SHIELD | Admitting: Nurse Practitioner

## 2022-08-06 ENCOUNTER — Telehealth: Payer: Self-pay | Admitting: Medical

## 2022-08-06 ENCOUNTER — Other Ambulatory Visit (HOSPITAL_COMMUNITY): Payer: Self-pay

## 2022-08-06 MED ORDER — COLCHICINE 0.6 MG PO TABS
0.6000 mg | ORAL_TABLET | Freq: Every day | ORAL | 3 refills | Status: DC
  Filled 2022-08-06: qty 90, 90d supply, fill #0

## 2022-08-06 NOTE — Telephone Encounter (Signed)
Medication: pt is wanting his rx for gout.   Has the patient contacted their pharmacy? No.   Preferred Pharmacy: [facility] [address] [place] [address] Phone: [phone]  Fax: [phone]

## 2022-08-06 NOTE — Telephone Encounter (Signed)
Rx sent 

## 2022-10-12 ENCOUNTER — Ambulatory Visit: Payer: Medicare Other | Admitting: Medical

## 2022-10-19 ENCOUNTER — Telehealth: Payer: Self-pay | Admitting: Medical

## 2022-10-19 ENCOUNTER — Other Ambulatory Visit (HOSPITAL_COMMUNITY): Payer: Self-pay

## 2022-10-19 MED ORDER — AMLODIPINE BESYLATE 5 MG PO TABS
5.0000 mg | ORAL_TABLET | Freq: Every day | ORAL | 3 refills | Status: DC
  Filled 2022-10-19: qty 90, 90d supply, fill #0

## 2022-10-19 MED ORDER — LOSARTAN POTASSIUM 50 MG PO TABS
50.0000 mg | ORAL_TABLET | Freq: Every day | ORAL | 3 refills | Status: DC
  Filled 2022-10-19: qty 90, 90d supply, fill #0

## 2022-10-19 MED ORDER — COLCHICINE 0.6 MG PO TABS
0.6000 mg | ORAL_TABLET | Freq: Every day | ORAL | 3 refills | Status: DC
  Filled 2022-10-19: qty 60, 60d supply, fill #0

## 2022-10-19 NOTE — Telephone Encounter (Signed)
Rx sent 

## 2022-10-19 NOTE — Telephone Encounter (Signed)
Patient requesting refill on his blood pressure and gout medication. Patient did not provide names-just simply whatever the last thing that was called in. Patient okay with [facility] [address].

## 2022-10-22 ENCOUNTER — Other Ambulatory Visit (HOSPITAL_COMMUNITY): Payer: Self-pay

## 2022-11-01 ENCOUNTER — Ambulatory Visit: Payer: Medicare Other | Admitting: Medical

## 2022-11-13 ENCOUNTER — Telehealth: Payer: Self-pay

## 2022-11-13 NOTE — Telephone Encounter (Signed)
Transition Care Management Unsuccessful Follow-up Telephone Call  Date of discharge and from where:  [facility] 11/09/2022  Attempts:  1st Attempt  Reason for unsuccessful TCM follow-up call:  Left voice message

## 2022-11-14 NOTE — Telephone Encounter (Signed)
Transition Care Management Unsuccessful Follow-up Telephone Call  Date of discharge and from where:  [facility] 11/09/2022  Attempts:  2nd Attempt  Reason for unsuccessful TCM follow-up call:  Left voice message

## 2022-11-15 NOTE — Telephone Encounter (Signed)
Transition Care Management Unsuccessful Follow-up Telephone Call  Date of discharge and from where:  [facility] 11/09/2022   Attempts:  3rd Attempt  Reason for unsuccessful TCM follow-up call:  Left voice message

## 2022-12-03 ENCOUNTER — Ambulatory Visit: Payer: Medicare Other | Admitting: Medical

## 2022-12-07 ENCOUNTER — Encounter: Payer: Self-pay | Admitting: Medical

## 2022-12-07 ENCOUNTER — Other Ambulatory Visit (HOSPITAL_COMMUNITY): Payer: Self-pay

## 2022-12-07 ENCOUNTER — Ambulatory Visit (INDEPENDENT_AMBULATORY_CARE_PROVIDER_SITE_OTHER): Payer: Medicare (Managed Care) | Admitting: Medical

## 2022-12-07 VITALS — BP 135/67 | HR 85 | Temp 98.0°F | Resp 18 | Ht 72.0 in | Wt 216.0 lb

## 2022-12-07 DIAGNOSIS — I10 Essential (primary) hypertension: Secondary | ICD-10-CM | POA: Diagnosis not present

## 2022-12-07 DIAGNOSIS — Z1211 Encounter for screening for malignant neoplasm of colon: Secondary | ICD-10-CM

## 2022-12-07 DIAGNOSIS — E785 Hyperlipidemia, unspecified: Secondary | ICD-10-CM

## 2022-12-07 DIAGNOSIS — M1000 Idiopathic gout, unspecified site: Secondary | ICD-10-CM

## 2022-12-07 MED ORDER — LOSARTAN POTASSIUM 50 MG PO TABS
50.0000 mg | ORAL_TABLET | Freq: Every day | ORAL | 3 refills | Status: DC
  Filled 2022-12-07 – 2023-02-08 (×2): qty 90, 90d supply, fill #0
  Filled 2023-05-15: qty 90, 90d supply, fill #1
  Filled 2023-07-15: qty 90, 90d supply, fill #2
  Filled 2023-09-23: qty 90, 90d supply, fill #3

## 2022-12-07 MED ORDER — AMLODIPINE BESYLATE 10 MG PO TABS
10.0000 mg | ORAL_TABLET | Freq: Every day | ORAL | 3 refills | Status: DC
  Filled 2022-12-07: qty 30, 30d supply, fill #0
  Filled 2023-10-28: qty 30, 30d supply, fill #1
  Filled 2023-11-25: qty 30, 30d supply, fill #2

## 2022-12-07 MED ORDER — ATORVASTATIN CALCIUM 10 MG PO TABS
10.0000 mg | ORAL_TABLET | Freq: Every day | ORAL | 11 refills | Status: AC
  Filled 2022-12-07: qty 30, 30d supply, fill #0
  Filled 2023-07-15: qty 30, 30d supply, fill #1

## 2022-12-07 MED ORDER — COLCHICINE 0.6 MG PO TABS
0.6000 mg | ORAL_TABLET | Freq: Every day | ORAL | 3 refills | Status: DC
  Filled 2022-12-07: qty 30, 30d supply, fill #0
  Filled 2023-02-08: qty 30, 30d supply, fill #1
  Filled 2023-03-11: qty 30, 30d supply, fill #2
  Filled 2023-03-14: qty 30, 30d supply, fill #3
  Filled 2023-04-04: qty 90, 90d supply, fill #3
  Filled 2023-06-13: qty 60, 60d supply, fill #4

## 2022-12-07 NOTE — Addendum Note (Signed)
Addended by: Manuela Schwartz on: 12/07/2022 03:01 PM   Modules accepted: Orders

## 2022-12-07 NOTE — Patient Instructions (Addendum)
1. Essential hypertension On check today twice bp 150/90. Increased amlodpine to 10 mg daily and refilled your losartan 50 mg daily rx. - Comp Met (CMET)  2. Idiopathic gout, unspecified chronicity, unspecified site Continue colchine for occasioanal flar. - Uric acid  3. Hyperlipidemia, unspecified hyperlipidemia type Rx atorvastatin to take daily along with low cholesterol diet. Reviewed 10 year cardiovasuclar risk score.  4. Screening for colon cancer Number is on summary. Please call to get scheduled. - Ambulatory referral to Gastroenterology   Follow up 6 month with me routine follow up. But in 2 weeks for nurse blood pressure check.

## 2022-12-07 NOTE — Addendum Note (Signed)
Addended by: Jeronimo Greaves on: 12/07/2022 03:02 PM   Modules accepted: Orders

## 2022-12-07 NOTE — Progress Notes (Signed)
Subjective:    Patient ID: [name] Frank, male    DOB: 1955/10/13, 67 y.o.   MRN: [id number]  HPI  Htn- bp very high initially.  Continue losartan 50 mg daily and add on amlodipine 5 mg daily. Occasionally report non compliance.   Gout- he states just taking colchicine only when has flares. Last flare was October. Last flares was left ankle. Last uric acid was 7.6   For high cholesterol had written atorvastatin 10 mg 3 times weekly. Last labs showed moderate high triglycerides. He tells me has not been taking.      The 10-year ASCVD risk score (Arnett DK, et al., 2019) is: 21.2%   Values used to calculate the score:     Age: 79 years     Sex: Male     Is Non-Hispanic African American: Yes     Diabetic: No     Tobacco smoker: No     Systolic Blood Pressure: [id number] mmHg     Is BP treated: Yes     HDL Cholesterol: 39.2 mg/dL     Total Cholesterol: 140 mg/dL   Pt due for colonoscopy.   Review of Systems  Constitutional:  Negative for chills, fatigue and fever.  HENT:  Negative for congestion, ear discharge and ear pain.   Respiratory:  Negative for cough, chest tightness, shortness of breath and wheezing.   Cardiovascular:  Negative for chest pain and palpitations.  Gastrointestinal:  Negative for abdominal pain.  Genitourinary:  Negative for dysuria, flank pain and frequency.  Musculoskeletal:  Negative for back pain and myalgias.  Skin:  Negative for pallor and rash.  Neurological:  Negative for dizziness and headaches.  Hematological:  Negative for adenopathy. Does not bruise/bleed easily.  Psychiatric/Behavioral:  Negative for behavioral problems and confusion.     Past Medical History:  Diagnosis Date   ACE-inhibitor cough    Fracture of leg 10/08/1986   right leg   Gout    Hypertension    Low back pain      Social History   Socioeconomic History   Marital status: Married    Spouse name: Not on file   Number of children: Not on file   Years of education:  Not on file   Highest education level: Not on file  Occupational History   Not on file  Tobacco Use   Smoking status: Never   Smokeless tobacco: Never  Substance and Sexual Activity   Alcohol use: No   Drug use: No   Sexual activity: Not on file  Other Topics Concern   Not on file  Social History Narrative   Occupation:  Administrator  Network engineer)   Married  3 grown children   Never Smoked   Alcohol use-no   Drug use-no       Smoking Status:  quit   Caffeine use/day:  NONE   Does Patient Exercise:  yes   Social Determinants of Radio broadcast assistant Strain: Not on file  Food Insecurity: Not on file  Transportation Needs: Not on file  Physical Activity: Not on file  Stress: Not on file  Social Connections: Not on file  Intimate Partner Violence: Not on file    Past Surgical History:  Procedure Laterality Date   LEG SURGERY     RIGHT   QUADRICEPS TENDON REPAIR Left 12/17/2012   Procedure: REPAIR QUADRICEP TENDON;  Surgeon: Marybelle Killings, MD;  Location: [facility];  Service: Orthopedics;  Laterality: Left;  Left Quad Tendon Repair    Family History  Problem Relation Age of Onset   Hypertension Mother     No Known Allergies  Current Outpatient Medications on File Prior to Visit  Medication Sig Dispense Refill   amLODipine (NORVASC) 5 MG tablet Take 1 tablet (5 mg total) by mouth daily. 90 tablet 3   atorvastatin (LIPITOR) 10 MG tablet Take 1 tablet (10 mg total) by mouth every Monday, Wednesday, and Friday. 45 tablet 3   colchicine 0.6 MG tablet Take 1 tablet (0.6 mg total) by mouth daily. 90 tablet 3   colchicine 0.6 MG tablet Take 1 tablet (0.6 mg total) by mouth daily. 60 tablet 3   diclofenac (VOLTAREN) 75 MG EC tablet Take 1 tablet (75 mg total) by mouth 2 (two) times daily. 20 tablet 0   losartan (COZAAR) 50 MG tablet Take 1 tablet (50 mg total) by mouth daily. 90 tablet 3   sildenafil (VIAGRA) 100 MG tablet Take 1 tablet (100 mg total) by mouth daily as  needed for erectile dysfunction. 10 tablet 0   traMADol (ULTRAM) 50 MG tablet Take 1 tablet (50 mg total) by mouth every 6 (six) hours as needed. 12 tablet 0   No current facility-administered medications on file prior to visit.    BP (!) 150/90   Pulse 85   Temp 98 F (36.7 C)   Resp 18   Ht 6' (1.829 m)   Wt 216 lb (98 kg)   SpO2 98%   BMI 29.29 kg/m        Objective:   Physical Exam  General Mental Status- Alert. General Appearance- Not in acute distress.   Skin General: Color- Normal Color. Moisture- Normal Moisture.  Neck Carotid Arteries- Normal color. Moisture- Normal Moisture. No carotid bruits. No JVD.  Chest and Lung Exam Auscultation: Breath Sounds:-Normal.  Cardiovascular Auscultation:Rythm- Regular. Murmurs & Other Heart Sounds:Auscultation of the heart reveals- No Murmurs.  Abdomen Inspection:-Inspeection Normal. Palpation/Percussion:Note:No mass. Palpation and Percussion of the abdomen reveal- Non Tender, Non Distended + BS, no rebound or guarding.   Neurologic Cranial Nerve exam:- CN III-XII intact(No nystagmus), symmetric smile. Strength:- 5/5 equal and symmetric strength both upper and lower extremities.       Assessment & Plan:   Patient Instructions  1. Essential hypertension On check today twice bp 150/90. Increased amlodpine to 10 mg daily and refilled your losartan 50 mg daily rx. - Comp Met (CMET)  2. Idiopathic gout, unspecified chronicity, unspecified site Continue colchine for occasioanal flar. - Uric acid  3. Hyperlipidemia, unspecified hyperlipidemia type Rx atorvastatin to take daily along with low cholesterol diet. Reviewed 10 year cardiovasuclar risk score.  4. Screening for colon cancer Number is on summary. Please call to get scheduled. - Ambulatory referral to Gastroenterology   Follow up 6 month with me routine follow up. But in 2 weeks for nurse blood pressure check.    Mackie Pai, PA-C

## 2022-12-08 LAB — COMPREHENSIVE METABOLIC PANEL
AG Ratio: 1.4 (calc) (ref 1.0–2.5)
ALT: 13 U/L (ref 9–46)
AST: 15 U/L (ref 10–35)
Albumin: 4.3 g/dL (ref 3.6–5.1)
Alkaline phosphatase (APISO): 77 U/L (ref 35–144)
BUN: 9 mg/dL (ref 7–25)
CO2: 28 mmol/L (ref 20–32)
Calcium: 9.4 mg/dL (ref 8.6–10.3)
Chloride: 104 mmol/L (ref 98–110)
Creat: 1.22 mg/dL (ref 0.70–1.35)
Globulin: 3 g/dL (calc) (ref 1.9–3.7)
Glucose, Bld: 86 mg/dL (ref 65–99)
Potassium: 4.2 mmol/L (ref 3.5–5.3)
Sodium: 139 mmol/L (ref 135–146)
Total Bilirubin: 0.6 mg/dL (ref 0.2–1.2)
Total Protein: 7.3 g/dL (ref 6.1–8.1)

## 2022-12-08 LAB — URIC ACID: Uric Acid, Serum: 6.8 mg/dL (ref 4.0–8.0)

## 2022-12-19 ENCOUNTER — Telehealth: Payer: Self-pay | Admitting: Medical

## 2022-12-19 NOTE — Telephone Encounter (Signed)
Pt.notified

## 2022-12-19 NOTE — Telephone Encounter (Signed)
Pt states he got a new bp cuff and does not feel it is neccessary to come in for a bp check. He has been checking his bp everyday and it is going down. Nv cx.  Mon- 135/67 Tues-133/70 Today- 131/75

## 2022-12-25 ENCOUNTER — Ambulatory Visit: Payer: Medicare Other

## 2023-01-08 ENCOUNTER — Telehealth: Payer: Self-pay | Admitting: Medical

## 2023-01-08 NOTE — Telephone Encounter (Signed)
Copied from [place] [id number]. Topic: Medicare AWV >> Jan 08, 2023  9:59 AM Devoria Glassing wrote: Reason for CRM: Called patient to schedule Medicare Annual Wellness Visit (AWV). Left message for patient to call back and schedule Medicare Annual Wellness Visit (AWV).  Last date of AWV: NONE  Please schedule an appointment at any time with 08/18/58Amber Bender, CMA  .  If any questions, please contact me  Thank you ,  Sherol Dade; [place]: [phone]

## 2023-01-11 ENCOUNTER — Ambulatory Visit (INDEPENDENT_AMBULATORY_CARE_PROVIDER_SITE_OTHER): Payer: Medicare Other | Admitting: *Deleted

## 2023-01-11 VITALS — Ht 72.0 in | Wt 205.0 lb

## 2023-01-11 DIAGNOSIS — Z0000 Encounter for general adult medical examination without abnormal findings: Secondary | ICD-10-CM

## 2023-01-11 NOTE — Progress Notes (Signed)
Subjective:   [name] Frank is a 67 y.o. male who presents for an Initial Medicare Annual Wellness Visit.  I connected with  [place] on 01/11/23 by a audio enabled telemedicine application and verified that I am speaking with the correct person using two identifiers.  Patient Location: Home  Provider Location: Office/Clinic  I discussed the limitations of evaluation and management by telemedicine. The patient expressed understanding and agreed to proceed.   Review of Systems     Cardiac Risk Factors include: advanced age (>42men, >77 women);male gender;dyslipidemia;hypertension     Objective:    Today's Vitals   01/11/23 1541  Weight: 205 lb (93 kg)  Height: 6' (1.829 m)   Body mass index is 27.8 kg/m.     01/11/2023    3:42 PM 12/18/2012    1:00 AM 12/16/2012    1:15 PM  Advanced Directives  Does Patient Have a Medical Advance Directive? No Patient does not have advance directive Patient does not have advance directive  Would patient like information on creating a medical advance directive? No - Patient declined      Current Medications (verified) Outpatient Encounter Medications as of 01/11/2023  Medication Sig   amLODipine (NORVASC) 10 MG tablet Take 1 tablet (10 mg total) by mouth daily.   atorvastatin (LIPITOR) 10 MG tablet Take 1 tablet (10 mg total) by mouth daily.   colchicine 0.6 MG tablet Take 1 tablet (0.6 mg total) by mouth daily.   diclofenac (VOLTAREN) 75 MG EC tablet Take 1 tablet (75 mg total) by mouth 2 (two) times daily.   losartan (COZAAR) 50 MG tablet Take 1 tablet (50 mg total) by mouth daily.   sildenafil (VIAGRA) 100 MG tablet Take 1 tablet (100 mg total) by mouth daily as needed for erectile dysfunction.   traMADol (ULTRAM) 50 MG tablet Take 1 tablet (50 mg total) by mouth every 6 (six) hours as needed.   [DISCONTINUED] colchicine 0.6 MG tablet Take 1 tablet (0.6 mg total) by mouth daily.   No facility-administered encounter medications on  file as of 01/11/2023.    Allergies (verified) Patient has no known allergies.   History: Past Medical History:  Diagnosis Date   ACE-inhibitor cough    Fracture of leg 10/08/1986   right leg   Gout    Hypertension    Low back pain    Past Surgical History:  Procedure Laterality Date   LEG SURGERY     RIGHT   QUADRICEPS TENDON REPAIR Left 12/17/2012   Procedure: REPAIR QUADRICEP TENDON;  Surgeon: Eldred Manges, MD;  Location: [facility];  Service: Orthopedics;  Laterality: Left;  Left Quad Tendon Repair   Family History  Problem Relation Age of Onset   Hypertension Mother    Social History   Socioeconomic History   Marital status: Married    Spouse name: Not on file   Number of children: Not on file   Years of education: Not on file   Highest education level: Not on file  Occupational History   Not on file  Tobacco Use   Smoking status: Never   Smokeless tobacco: Never  Substance and Sexual Activity   Alcohol use: No   Drug use: No   Sexual activity: Not on file  Other Topics Concern   Not on file  Social History Narrative   Occupation:  Truck Driver  Museum/gallery conservator)   Married  3 grown children   Never Smoked   Alcohol use-no  Drug use-no       Smoking Status:  quit   Caffeine use/day:  NONE   Does Patient Exercise:  yes   Social Determinants of Health   Financial Resource Strain: Low Risk  (01/11/2023)   Overall Financial Resource Strain (CARDIA)    Difficulty of Paying Living Expenses: Not hard at all  Food Insecurity: No Food Insecurity (01/11/2023)   Hunger Vital Sign    Worried About Running Out of Food in the Last Year: Never true    Ran Out of Food in the Last Year: Never true  Transportation Needs: No Transportation Needs (01/11/2023)   PRAPARE - Administrator, Civil Service (Medical): No    Lack of Transportation (Non-Medical): No  Physical Activity: Inactive (01/11/2023)   Exercise Vital Sign    Days of Exercise per Week: 0 days    Minutes  of Exercise per Session: 0 min  Stress: No Stress Concern Present (01/11/2023)   [employer] of Occupational Health - Occupational Stress Questionnaire    Feeling of Stress : Not at all  Social Connections: Socially Integrated (01/11/2023)   Social Connection and Isolation Panel [NHANES]    Frequency of Communication with Friends and Family: More than three times a week    Frequency of Social Gatherings with Friends and Family: Once a week    Attends Religious Services: More than 4 times per year    Active Member of [employer] or Organizations: Yes    Attends Engineer, structural: More than 4 times per year    Marital Status: Married    Tobacco Counseling Counseling given: Not Answered   Clinical Intake:  Pre-visit preparation completed: Yes  Pain : No/denies pain  BMI - recorded: 27.8 Nutritional Status: BMI 25 -29 Overweight Diabetes: No  How often do you need to have someone help you when you read instructions, pamphlets, or other written materials from your doctor or pharmacy?: 1 - Never  Activities of Daily Living    01/11/2023    3:46 PM  In your present state of health, do you have any difficulty performing the following activities:  Hearing? 0  Vision? 0  Difficulty concentrating or making decisions? 0  Walking or climbing stairs? 0  Dressing or bathing? 0  Doing errands, shopping? 0  Preparing Food and eating ? N  Using the Toilet? N  In the past six months, have you accidently leaked urine? N  Do you have problems with loss of bowel control? N  Managing your Medications? N  Managing your Finances? N  Housekeeping or managing your Housekeeping? N    Patient Care Team: Saguier, Kateri Mc as PCP - General (Internal Medicine)  Indicate any recent Medical Services you may have received from other than [facility] providers in the past year (date may be approximate).     Assessment:   This is a routine wellness examination for [name] Frank.  Hearing/Vision  screen No results found.  Dietary issues and exercise activities discussed: Current Exercise Habits: Home exercise routine, Type of exercise: walking, Time (Minutes): > 60, Frequency (Times/Week): 5, Weekly Exercise (Minutes/Week): 0, Intensity: Mild, Exercise limited by: None identified   Goals Addressed   None    Depression Screen    01/11/2023    3:46 PM 09/27/2017    3:47 PM  PHQ 2/9 Scores  PHQ - 2 Score 0 0    Fall Risk    01/11/2023    3:43 PM 06/08/2022   11:40 AM  Fall Risk   Falls in the past year? 0 0  Number falls in past yr: 0 0  Injury with Fall? 0 0  Risk for fall due to : No Fall Risks   Follow up Falls evaluation completed Falls evaluation completed    FALL RISK PREVENTION PERTAINING TO THE HOME:  Any stairs in or around the home? Yes  Home free of loose throw rugs in walkways, pet beds, electrical cords, etc? Yes  Adequate lighting in your home to reduce risk of falls? Yes   ASSISTIVE DEVICES UTILIZED TO PREVENT FALLS:  Life alert? No  Use of a cane, walker or w/c? No  Grab bars in the bathroom? No  Shower chair or bench in shower? No  Elevated toilet seat or a handicapped toilet? No   TIMED UP AND GO:  Was the test performed?  No, audio visit .    Cognitive Function:    01/11/2023    4:10 PM  MMSE - Mini Mental State Exam  Not completed: Unable to complete        Immunizations Immunization History  Administered Date(s) Administered   Influenza Whole 08/25/2007   Influenza,inj,Quad PF,6+ Mos 06/08/2022   PNEUMOCOCCAL CONJUGATE-20 06/08/2022   Td 06/02/2010    TDAP status: Due, Education has been provided regarding the importance of this vaccine. Advised may receive this vaccine at local pharmacy or Health Dept. Aware to provide a copy of the vaccination record if obtained from local pharmacy or Health Dept. Verbalized acceptance and understanding.  Flu Vaccine status: Up to date  Pneumococcal vaccine status: Up to date  Covid-19  vaccine status: Declined, Education has been provided regarding the importance of this vaccine but patient still declined. Advised may receive this vaccine at local pharmacy or Health Dept.or vaccine clinic. Aware to provide a copy of the vaccination record if obtained from local pharmacy or Health Dept. Verbalized acceptance and understanding.  Qualifies for Shingles Vaccine? Yes   Zostavax completed No   Shingrix Completed?: No.    Education has been provided regarding the importance of this vaccine. Patient has been advised to call insurance company to determine out of pocket expense if they have not yet received this vaccine. Advised may also receive vaccine at local pharmacy or Health Dept. Verbalized acceptance and understanding.  Screening Tests Health Maintenance  Topic Date Due   Medicare Annual Wellness (AWV)  Never done   COVID-19 Vaccine (1) Never done   Zoster Vaccines- Shingrix (1 of 2) Never done   COLONOSCOPY (Pts 45-3836yrs Insurance coverage will need to be confirmed)  05/01/2017   DTaP/Tdap/Td (2 - Tdap) 06/02/2020   Hepatitis C Screening  01/18/2027 (Originally 12/02/1973)   INFLUENZA VACCINE  05/09/2023   Pneumonia Vaccine 8165+ Years old  Completed   HPV VACCINES  Aged Out    Health Maintenance  Health Maintenance Due  Topic Date Due   Medicare Annual Wellness (AWV)  Never done   COVID-19 Vaccine (1) Never done   Zoster Vaccines- Shingrix (1 of 2) Never done   COLONOSCOPY (Pts 45-5736yrs Insurance coverage will need to be confirmed)  05/01/2017   DTaP/Tdap/Td (2 - Tdap) 06/02/2020    Colorectal cancer screening: Referral to GI placed 12/07/22. Pt aware the office will call re: appt.  Lung Cancer Screening: (Low Dose CT Chest recommended if Age 95-80 years, 30 pack-year currently smoking OR have quit w/in 15years.) does not qualify.   Additional Screening:  Hepatitis C Screening: does qualify; Completed N/a  Vision  Screening: Recommended annual ophthalmology exams  for early detection of glaucoma and other disorders of the eye. Is the patient up to date with their annual eye exam?  No  Who is the provider or what is the name of the office in which the patient attends annual eye exams? Can't remember name at this time If pt is not established with a provider, would they like to be referred to a provider to establish care? No .   Dental Screening: Recommended annual dental exams for proper oral hygiene  Community Resource Referral / Chronic Care Management: CRR required this visit?  No   CCM required this visit?  No      Plan:     I have personally reviewed and noted the following in the patient's chart:   Medical and social history Use of alcohol, tobacco or illicit drugs  Current medications and supplements including opioid prescriptions. Patient is currently taking opioid prescriptions. Information provided to patient regarding non-opioid alternatives. Patient advised to discuss non-opioid treatment plan with their provider. Functional ability and status Nutritional status Physical activity Advanced directives List of other physicians Hospitalizations, surgeries, and ER visits in previous 12 months Vitals Screenings to include cognitive, depression, and falls Referrals and appointments  In addition, I have reviewed and discussed with patient certain preventive protocols, quality metrics, and best practice recommendations. A written personalized care plan for preventive services as well as general preventive health recommendations were provided to patient.   Due to this being a telephonic visit, the after visit summary with patients personalized plan was offered to patient via mail or my-chart. Patient declined at this time.   [name] Frank, [name] Frank, [place]MA   01/11/2023   Nurse Notes: None

## 2023-01-11 NOTE — Patient Instructions (Signed)
[name] Frank , Thank you for taking time to come for your Medicare Wellness Visit. I appreciate your ongoing commitment to your health goals. Please review the following plan we discussed and let me know if I can assist you in the future.   These are the goals we discussed:  Goals   None     This is a list of the screening recommended for you and due dates:  Health Maintenance  Topic Date Due   COVID-19 Vaccine (1) Never done   Zoster (Shingles) Vaccine (1 of 2) Never done   Colon Cancer Screening  05/01/2017   DTaP/Tdap/Td vaccine (2 - Tdap) 06/02/2020   Hepatitis C Screening: USPSTF Recommendation to screen - Ages 18-79 yo.  01/18/2027*   Flu Shot  05/09/2023   Medicare Annual Wellness Visit  01/11/2024   Pneumonia Vaccine  Completed   HPV Vaccine  Aged Out  *Topic was postponed. The date shown is not the original due date.     Next appointment: Follow up in one year for your annual wellness visit.   Preventive Care 38 Years and Older, Male Preventive care refers to lifestyle choices and visits with your health care provider that can promote health and wellness. What does preventive care include? A yearly physical exam. This is also called an annual well check. Dental exams once or twice a year. Routine eye exams. Ask your health care provider how often you should have your eyes checked. Personal lifestyle choices, including: Daily care of your teeth and gums. Regular physical activity. Eating a healthy diet. Avoiding tobacco and drug use. Limiting alcohol use. Practicing safe sex. Taking low doses of aspirin every day. Taking vitamin and mineral supplements as recommended by your health care provider. What happens during an annual well check? The services and screenings done by your health care provider during your annual well check will depend on your age, overall health, lifestyle risk factors, and family history of disease. Counseling  Your health care provider may  ask you questions about your: Alcohol use. Tobacco use. Drug use. Emotional well-being. Home and relationship well-being. Sexual activity. Eating habits. History of falls. Memory and ability to understand (cognition). Work and work Astronomer. Screening  You may have the following tests or measurements: Height, weight, and BMI. Blood pressure. Lipid and cholesterol levels. These may be checked every 5 years, or more frequently if you are over 93 years old. Skin check. Lung cancer screening. You may have this screening every year starting at age 66 if you have a 30-pack-year history of smoking and currently smoke or have quit within the past 15 years. Fecal occult blood test (FOBT) of the stool. You may have this test every year starting at age 46. Flexible sigmoidoscopy or colonoscopy. You may have a sigmoidoscopy every 5 years or a colonoscopy every 10 years starting at age 66. Prostate cancer screening. Recommendations will vary depending on your family history and other risks. Hepatitis C blood test. Hepatitis B blood test. Sexually transmitted disease (STD) testing. Diabetes screening. This is done by checking your blood sugar (glucose) after you have not eaten for a while (fasting). You may have this done every 1-3 years. Abdominal aortic aneurysm (AAA) screening. You may need this if you are a current or former smoker. Osteoporosis. You may be screened starting at age 48 if you are at high risk. Talk with your health care provider about your test results, treatment options, and if necessary, the need for more tests. Vaccines  Your health care provider may recommend certain vaccines, such as: Influenza vaccine. This is recommended every year. Tetanus, diphtheria, and acellular pertussis (Tdap, Td) vaccine. You may need a Td booster every 10 years. Zoster vaccine. You may need this after age 33. Pneumococcal 13-valent conjugate (PCV13) vaccine. One dose is recommended after age  70. Pneumococcal polysaccharide (PPSV23) vaccine. One dose is recommended after age 43. Talk to your health care provider about which screenings and vaccines you need and how often you need them. This information is not intended to replace advice given to you by your health care provider. Make sure you discuss any questions you have with your health care provider. Document Released: 10/21/2015 Document Revised: 06/13/2016 Document Reviewed: 07/26/2015 Elsevier Interactive Patient Education  2017 [employer] Prevention in the Home Falls can cause injuries. They can happen to people of all ages. There are many things you can do to make your home safe and to help prevent falls. What can I do on the outside of my home? Regularly fix the edges of walkways and driveways and fix any cracks. Remove anything that might make you trip as you walk through a door, such as a raised step or threshold. Trim any bushes or trees on the path to your home. Use bright outdoor lighting. Clear any walking paths of anything that might make someone trip, such as rocks or tools. Regularly check to see if handrails are loose or broken. Make sure that both sides of any steps have handrails. Any raised decks and porches should have guardrails on the edges. Have any leaves, snow, or ice cleared regularly. Use sand or salt on walking paths during winter. Clean up any spills in your garage right away. This includes oil or grease spills. What can I do in the bathroom? Use night lights. Install grab bars by the toilet and in the tub and shower. Do not use towel bars as grab bars. Use non-skid mats or decals in the tub or shower. If you need to sit down in the shower, use a plastic, non-slip stool. Keep the floor dry. Clean up any water that spills on the floor as soon as it happens. Remove soap buildup in the tub or shower regularly. Attach bath mats securely with double-sided non-slip rug tape. Do not have throw  rugs and other things on the floor that can make you trip. What can I do in the bedroom? Use night lights. Make sure that you have a light by your bed that is easy to reach. Do not use any sheets or blankets that are too big for your bed. They should not hang down onto the floor. Have a firm chair that has side arms. You can use this for support while you get dressed. Do not have throw rugs and other things on the floor that can make you trip. What can I do in the kitchen? Clean up any spills right away. Avoid walking on wet floors. Keep items that you use a lot in easy-to-reach places. If you need to reach something above you, use a strong step stool that has a grab bar. Keep electrical cords out of the way. Do not use floor polish or wax that makes floors slippery. If you must use wax, use non-skid floor wax. Do not have throw rugs and other things on the floor that can make you trip. What can I do with my stairs? Do not leave any items on the stairs. Make sure that there are  handrails on both sides of the stairs and use them. Fix handrails that are broken or loose. Make sure that handrails are as long as the stairways. Check any carpeting to make sure that it is firmly attached to the stairs. Fix any carpet that is loose or worn. Avoid having throw rugs at the top or bottom of the stairs. If you do have throw rugs, attach them to the floor with carpet tape. Make sure that you have a light switch at the top of the stairs and the bottom of the stairs. If you do not have them, ask someone to add them for you. What else can I do to help prevent falls? Wear shoes that: Do not have high heels. Have rubber bottoms. Are comfortable and fit you well. Are closed at the toe. Do not wear sandals. If you use a stepladder: Make sure that it is fully opened. Do not climb a closed stepladder. Make sure that both sides of the stepladder are locked into place. Ask someone to hold it for you, if  possible. Clearly mark and make sure that you can see: Any grab bars or handrails. First and last steps. Where the edge of each step is. Use tools that help you move around (mobility aids) if they are needed. These include: Canes. Walkers. Scooters. Crutches. Turn on the lights when you go into a dark area. Replace any light bulbs as soon as they burn out. Set up your furniture so you have a clear path. Avoid moving your furniture around. If any of your floors are uneven, fix them. If there are any pets around you, be aware of where they are. Review your medicines with your doctor. Some medicines can make you feel dizzy. This can increase your chance of falling. Ask your doctor what other things that you can do to help prevent falls. This information is not intended to replace advice given to you by your health care provider. Make sure you discuss any questions you have with your health care provider. Document Released: 07/21/2009 Document Revised: 03/01/2016 Document Reviewed: 10/29/2014 Elsevier Interactive Patient Education  2017 [employer].

## 2023-02-07 ENCOUNTER — Telehealth: Payer: Self-pay | Admitting: Medical

## 2023-02-07 NOTE — Telephone Encounter (Signed)
Error

## 2023-02-08 ENCOUNTER — Encounter: Payer: Self-pay | Admitting: Emergency Medicine

## 2023-02-08 ENCOUNTER — Other Ambulatory Visit (HOSPITAL_COMMUNITY): Payer: Self-pay

## 2023-02-11 ENCOUNTER — Other Ambulatory Visit (HOSPITAL_COMMUNITY): Payer: Self-pay

## 2023-03-11 ENCOUNTER — Other Ambulatory Visit (HOSPITAL_COMMUNITY): Payer: Self-pay

## 2023-03-13 ENCOUNTER — Telehealth: Payer: Self-pay | Admitting: Internal Medicine

## 2023-03-13 NOTE — Telephone Encounter (Signed)
Left message for patient to call back. If he wishes to decline colonoscopy and have cologuard instead, his primary care should be able order this test instead of him having to come see [place].

## 2023-03-13 NOTE — Telephone Encounter (Signed)
Inbound call from patient wanting to have a cologurd test done instead of procedure. Please advise.

## 2023-03-14 ENCOUNTER — Telehealth: Payer: Self-pay | Admitting: Medical

## 2023-03-14 ENCOUNTER — Other Ambulatory Visit (HOSPITAL_COMMUNITY): Payer: Self-pay

## 2023-03-14 DIAGNOSIS — Z1211 Encounter for screening for malignant neoplasm of colon: Secondary | ICD-10-CM

## 2023-03-14 NOTE — Telephone Encounter (Signed)
Pt stated that he was requesting a cologuard test to be ordered. Pt stated that his PCP recommended him to have one. Pt chart reviewed. Last colonoscopy was 2008. Pt was notified that I could set him up with an office visit with Dr. Leone Payor to discuss his recommendations. Pt stated that he just wanted to proceed with the Cologuard test first and then explore the Colonoscopy route if the test comes back abnormal. Pt was notified to reach out to his PCP in regard to ordering  the Colorguard test. Pt verbalized understanding with all questions answered.

## 2023-03-14 NOTE — Telephone Encounter (Signed)
Pt stated he no longer wants a colonoscopy and would like pcp to order a cologuard instead. Please advise pt once kit is ordered.

## 2023-03-15 NOTE — Telephone Encounter (Signed)
Cologuard ordered due to GI already going over info with patient in previous office note

## 2023-03-28 LAB — COLOGUARD: COLOGUARD: NEGATIVE

## 2023-04-04 ENCOUNTER — Other Ambulatory Visit (HOSPITAL_COMMUNITY): Payer: Self-pay

## 2023-05-15 ENCOUNTER — Other Ambulatory Visit (HOSPITAL_COMMUNITY): Payer: Self-pay

## 2023-06-13 ENCOUNTER — Other Ambulatory Visit (HOSPITAL_COMMUNITY): Payer: Self-pay

## 2023-06-14 ENCOUNTER — Ambulatory Visit: Payer: Medicare Other | Admitting: Medical

## 2023-07-01 ENCOUNTER — Other Ambulatory Visit (HOSPITAL_COMMUNITY): Payer: Self-pay

## 2023-07-01 ENCOUNTER — Ambulatory Visit: Payer: Medicare Other

## 2023-07-01 ENCOUNTER — Encounter: Payer: Self-pay | Admitting: Medical

## 2023-07-01 ENCOUNTER — Ambulatory Visit (INDEPENDENT_AMBULATORY_CARE_PROVIDER_SITE_OTHER): Payer: Medicare (Managed Care) | Admitting: Medical

## 2023-07-01 VITALS — BP 140/80 | HR 74 | Temp 99.0°F | Resp 18 | Ht 72.0 in | Wt 211.2 lb

## 2023-07-01 DIAGNOSIS — E785 Hyperlipidemia, unspecified: Secondary | ICD-10-CM

## 2023-07-01 DIAGNOSIS — Z23 Encounter for immunization: Secondary | ICD-10-CM

## 2023-07-01 DIAGNOSIS — I10 Essential (primary) hypertension: Secondary | ICD-10-CM

## 2023-07-01 DIAGNOSIS — M1000 Idiopathic gout, unspecified site: Secondary | ICD-10-CM | POA: Diagnosis not present

## 2023-07-01 LAB — URIC ACID: Uric Acid, Serum: 6.2 mg/dL (ref 4.0–7.8)

## 2023-07-01 LAB — COMPREHENSIVE METABOLIC PANEL
ALT: 18 U/L (ref 0–53)
AST: 14 U/L (ref 0–37)
Albumin: 4.2 g/dL (ref 3.5–5.2)
Alkaline Phosphatase: 86 U/L (ref 39–117)
BUN: 8 mg/dL (ref 6–23)
CO2: 30 mEq/L (ref 19–32)
Calcium: 8.9 mg/dL (ref 8.4–10.5)
Chloride: 105 mEq/L (ref 96–112)
Creatinine, Ser: 1.13 mg/dL (ref 0.40–1.50)
GFR: 67.7 mL/min (ref >60.00)
Glucose, Bld: 89 mg/dL (ref 70–99)
Potassium: 4 mEq/L (ref 3.5–5.1)
Sodium: 141 mEq/L (ref 135–145)
Total Bilirubin: 0.9 mg/dL (ref 0.2–1.2)
Total Protein: 7 g/dL (ref 6.0–8.3)

## 2023-07-01 LAB — LIPID PANEL
Cholesterol: 131 mg/dL (ref 0–200)
HDL: 37.8 mg/dL — ABNORMAL LOW (ref >39.00)
LDL Cholesterol: 57 mg/dL (ref 0–99)
NonHDL: 93.65
Total CHOL/HDL Ratio: 3
Triglycerides: 182 mg/dL — ABNORMAL HIGH (ref 0.0–149.0)
VLDL: 36.4 mg/dL (ref 0.0–40.0)

## 2023-07-01 MED ORDER — COLCHICINE 0.6 MG PO TABS
0.6000 mg | ORAL_TABLET | Freq: Every day | ORAL | 3 refills | Status: DC
  Filled 2023-07-01 – 2023-07-17 (×3): qty 60, 60d supply, fill #0
  Filled 2023-08-29: qty 60, 60d supply, fill #1
  Filled 2023-10-07: qty 60, 60d supply, fill #2
  Filled 2023-10-07: qty 39, 39d supply, fill #2
  Filled 2023-10-07: qty 21, 21d supply, fill #2
  Filled 2023-12-06: qty 60, 60d supply, fill #3

## 2023-07-01 NOTE — Patient Instructions (Addendum)
Essential hypertension -borderline today at 140/80 checked twice but  only on losartan 50 mg daily. He states at home his bp is controlled 130/80. I had rx'd amlodipine in addition to losartan in the past.  -ask you to schedule nurse bp check but bring your machine in as want to check your bp machine level against manual reading. If on nurse bp check follow bp still high then would add back amlodipine.  -need to make sure at home readings accurate. -cmp   Hyperlipidemia, unspecified hyperlipidemia type -advise start atorvastatin.  -lipid panel    Idiopathic gout, unspecified chronicity, unspecified site -gout controlled. Refille colchicine. Uric acid  Need for influenza vaccination - Flu Vaccine Trivalent High Dose (Fluad)   Nurse bp check visit later this week or early next week. Bring your machine in as well.  Follow up date to be determined after lab review.

## 2023-07-01 NOTE — Progress Notes (Signed)
Subjective:    Patient ID: [name] Frank, male    DOB: [birth year], 67 y.o.   MRN: [id number]  HPI  Pt I  In for follow up.  Last AVS    "1. Essential hypertension On check today twice bp 150/90. Increased amlodpine to 10 mg daily and refilled your losartan 50 mg daily rx. - Comp Met (CMET)   2. Idiopathic gout, unspecified chronicity, unspecified site Continue colchine for occasioanal flar. - Uric acid   3. Hyperlipidemia, unspecified hyperlipidemia type Rx atorvastatin to take daily along with low cholesterol diet. Reviewed 10 year cardiovasuclar risk score.   4. Screening for colon cancer Number is on summary. Please call to get scheduled. - Ambulatory referral to Gastroenterology"   Htn- borderline today at 140/80 checked twice but  only on losartan 50 mg daily. He states at home his bp is controlled 130/80. I had rx'd amlodipine in addition to losartan in the past.   Pt states not on cholesterol med. He only used atorvastatin for one month. No side effects.  Reviewed his ascvd score with him today.  The 10-year ASCVD risk score (Arnett DK, et al., 2019) is: 16.6%   Values used to calculate the score:     Age: 44 years     Sex: Male     Is Non-Hispanic African American: Yes     Diabetic: No     Tobacco smoker: No     Systolic Blood Pressure: 130 mmHg     Is BP treated: Yes     HDL Cholesterol: 39.2 mg/dL     Total Cholesterol: 140 mg/dL    Gout- pt states since he has been on colchicine he has had no gout flares.    Review of Systems See hpi.    Objective:   Physical Exam  General Mental Status- Alert. General Appearance- Not in acute distress.   Skin General: Color- Normal Color. Moisture- Normal Moisture.  Neck Carotid Arteries- Normal color. Moisture- Normal Moisture. No carotid bruits. No JVD.  Chest and Lung Exam Auscultation: Breath Sounds:-Normal.  Cardiovascular Auscultation:Rythm- Regular. Murmurs & Other Heart  Sounds:Auscultation of the heart reveals- No Murmurs.  Abdomen Inspection:-Inspeection Normal. Palpation/Percussion:Note:No mass. Palpation and Percussion of the abdomen reveal- Non Tender, Non Distended + BS, no rebound or guarding.   Neurologic Cranial Nerve exam:- CN III-XII intact(No nystagmus), symmetric smile. Strength:- 5/5 equal and symmetric strength both upper and lower extremities.       Assessment & Plan:   Patient Instructions  Essential hypertension -borderline today at 140/80 checked twice but  only on losartan 50 mg daily. He states at home his bp is controlled 130/80. I had rx'd amlodipine in addition to losartan in the past.  -ask you to schedule nurse bp check but bring your machine in as want to check your bp machine level against manual reading. If on nurse bp check follow bp still high then would add back amlodipine.  -need to make sure at home readings accurate. -cmp   Hyperlipidemia, unspecified hyperlipidemia type -advise start atorvastatin.  -lipid panel    Idiopathic gout, unspecified chronicity, unspecified site -gout controlled. Refille colchicine. Uric acid  Need for influenza vaccination - Flu Vaccine Trivalent High Dose (Fluad)   Nurse bp check visit later this week or early next week. Bring your machine in as well.  Follow up date to be determined after lab review.   Esperanza Richters, PA-C

## 2023-07-10 ENCOUNTER — Ambulatory Visit: Payer: Medicare Other

## 2023-07-12 ENCOUNTER — Telehealth: Payer: Self-pay | Admitting: Medical

## 2023-07-12 NOTE — Telephone Encounter (Signed)
error 

## 2023-07-15 ENCOUNTER — Other Ambulatory Visit (HOSPITAL_COMMUNITY): Payer: Self-pay

## 2023-07-16 ENCOUNTER — Telehealth: Payer: Self-pay | Admitting: Medical

## 2023-07-16 NOTE — Telephone Encounter (Signed)
Pt said his provider wanted him to check his blood pressure. Pt went to [facility] yesterday and it was 131/98 and then to [facility] and his blood pressure was 134/72. Pt said that it's a little inconvenient to get over here from [facility] and wants to know if he continues to keep up with monitoring his bp if he can cancel the nurse visit on 10/15. Please call to advise

## 2023-07-16 NOTE — Telephone Encounter (Signed)
Pt called and lvm to return call 

## 2023-07-16 NOTE — Telephone Encounter (Signed)
Pt called back , made pt aware of changes and told him to call our office with bp readings from new machine Appt cancelled

## 2023-07-17 ENCOUNTER — Other Ambulatory Visit (HOSPITAL_COMMUNITY): Payer: Self-pay

## 2023-07-23 ENCOUNTER — Ambulatory Visit: Payer: Medicare Other

## 2023-07-23 ENCOUNTER — Other Ambulatory Visit (HOSPITAL_COMMUNITY): Payer: Self-pay

## 2023-08-29 ENCOUNTER — Other Ambulatory Visit (HOSPITAL_COMMUNITY): Payer: Self-pay

## 2023-09-09 ENCOUNTER — Other Ambulatory Visit (HOSPITAL_COMMUNITY): Payer: Self-pay

## 2023-09-23 ENCOUNTER — Other Ambulatory Visit (HOSPITAL_COMMUNITY): Payer: Self-pay

## 2023-10-07 ENCOUNTER — Other Ambulatory Visit (HOSPITAL_COMMUNITY): Payer: Self-pay

## 2023-10-28 ENCOUNTER — Other Ambulatory Visit (HOSPITAL_COMMUNITY): Payer: Self-pay

## 2023-11-25 ENCOUNTER — Other Ambulatory Visit (HOSPITAL_COMMUNITY): Payer: Self-pay

## 2023-12-06 ENCOUNTER — Other Ambulatory Visit: Payer: Self-pay | Admitting: Medical

## 2023-12-06 ENCOUNTER — Other Ambulatory Visit (HOSPITAL_COMMUNITY): Payer: Self-pay

## 2023-12-06 MED ORDER — LOSARTAN POTASSIUM 50 MG PO TABS
50.0000 mg | ORAL_TABLET | Freq: Every day | ORAL | 3 refills | Status: DC
  Filled 2023-12-06: qty 90, 90d supply, fill #0

## 2023-12-30 ENCOUNTER — Ambulatory Visit (INDEPENDENT_AMBULATORY_CARE_PROVIDER_SITE_OTHER): Payer: Medicare (Managed Care) | Admitting: Medical

## 2023-12-30 ENCOUNTER — Encounter: Payer: Self-pay | Admitting: Medical

## 2023-12-30 VITALS — BP 132/78 | HR 85 | Temp 98.0°F | Resp 18 | Ht 72.0 in | Wt 203.0 lb

## 2023-12-30 DIAGNOSIS — E785 Hyperlipidemia, unspecified: Secondary | ICD-10-CM

## 2023-12-30 DIAGNOSIS — I10 Essential (primary) hypertension: Secondary | ICD-10-CM | POA: Diagnosis not present

## 2023-12-30 DIAGNOSIS — R739 Hyperglycemia, unspecified: Secondary | ICD-10-CM

## 2023-12-30 DIAGNOSIS — M1000 Idiopathic gout, unspecified site: Secondary | ICD-10-CM

## 2023-12-30 LAB — LIPID PANEL
Cholesterol: 123 mg/dL (ref 0–200)
HDL: 39.7 mg/dL (ref >39.00)
LDL Cholesterol: 59 mg/dL (ref 0–99)
NonHDL: 83.72
Total CHOL/HDL Ratio: 3
Triglycerides: 126 mg/dL (ref 0.0–149.0)
VLDL: 25.2 mg/dL (ref 0.0–40.0)

## 2023-12-30 LAB — COMPREHENSIVE METABOLIC PANEL
ALT: 18 U/L (ref 0–53)
AST: 17 U/L (ref 0–37)
Albumin: 4.5 g/dL (ref 3.5–5.2)
Alkaline Phosphatase: 94 U/L (ref 39–117)
BUN: 8 mg/dL (ref 6–23)
CO2: 29 meq/L (ref 19–32)
Calcium: 9.5 mg/dL (ref 8.4–10.5)
Chloride: 104 meq/L (ref 96–112)
Creatinine, Ser: 1.19 mg/dL (ref 0.40–1.50)
GFR: 63.4 mL/min (ref >60.00)
Glucose, Bld: 95 mg/dL (ref 70–99)
Potassium: 4.5 meq/L (ref 3.5–5.1)
Sodium: 139 meq/L (ref 135–145)
Total Bilirubin: 0.6 mg/dL (ref 0.2–1.2)
Total Protein: 7.5 g/dL (ref 6.0–8.3)

## 2023-12-30 LAB — URIC ACID: Uric Acid, Serum: 6.2 mg/dL (ref 4.0–7.8)

## 2023-12-30 LAB — HEMOGLOBIN A1C: Hgb A1c MFr Bld: 5.3 % (ref 4.6–6.5)

## 2023-12-30 NOTE — Progress Notes (Signed)
 Subjective:    Patient ID: [name] Frank, male    DOB: [birth year], 68 y.o.   MRN: [id number]  HPI  Discussed the use of AI scribe software for clinical note transcription with the patient, who gave verbal consent to proceed.  History of Present Illness   [name] Frank is a 68 year old male with hypertension, gout, and hyperlipidemia who presents for routine follow-up.  He is currently taking amlodipine 10 mg and losartan 50 mg daily for blood pressure management. His blood pressure was initially high today. He checks his blood pressure at home about once a month, which is infrequent. No dizziness or lightheadedness.  For hyperlipidemia, he is on atorvastatin and is fasting today for a metabolic panel and cholesterol check. 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  For gout management, he takes colchicine and notes that his uric acid level was normal six months ago. He has not experienced any recent gout flares.  His past medical history includes a slightly elevated blood sugar level years ago, and he is due for a check of his A1c to assess his three-month sugar average. He completed a Cologuard test, which was negative.  He is a retired Naval architect, now working as a Electrical engineer at [employer]. He was laid off during the COVID-19 pandemic. Now works Electrical engineer at [employer].            Review of Systems  Constitutional:  Negative for chills, fatigue and fever.  HENT:  Negative for congestion, ear discharge, ear pain, postnasal drip, sinus pain and tinnitus.   Respiratory:  Negative for cough, chest tightness and wheezing.   Cardiovascular:  Negative for chest pain and palpitations.  Gastrointestinal:  Negative for abdominal pain, blood in stool, diarrhea and vomiting.  Genitourinary:  Negative for dysuria, frequency, hematuria and testicular pain.   Musculoskeletal:  Negative for back pain, myalgias and neck stiffness.  Skin:  Negative for rash.  Neurological:  Negative for dizziness, speech difficulty, weakness and light-headedness.  Hematological:  Negative for adenopathy.  Psychiatric/Behavioral:  Negative for behavioral problems and decreased concentration.     Past Medical History:  Diagnosis Date   ACE-inhibitor cough    Fracture of leg 10/08/1986   right leg   Gout    Hypertension    Low back pain      Social History   Socioeconomic History   Marital status: Married    Spouse name: Not on file   Number of children: Not on file   Years of education: Not on file   Highest education level: Not on file  Occupational History   Not on file  Tobacco Use   Smoking status: Never   Smokeless tobacco: Never  Substance and Sexual Activity   Alcohol use: No   Drug use: No   Sexual activity: Not on file  Other Topics Concern   Not on file  Social History Narrative   Occupation:  Naval architect  Museum/gallery conservator)   Married  3 grown children   Never Smoked   Alcohol use-no   Drug use-no       Smoking Status:  quit   Caffeine use/day:  NONE   Does Patient Exercise:  yes   Social Drivers of Corporate investment banker Strain: Low Risk  (01/11/2023)   Overall Financial Resource Strain (CARDIA)  Difficulty of Paying Living Expenses: Not hard at all  Food Insecurity: No Food Insecurity (01/11/2023)   Hunger Vital Sign    Worried About Running Out of Food in the Last Year: Never true    Ran Out of Food in the Last Year: Never true  Transportation Needs: No Transportation Needs (01/11/2023)   PRAPARE - Administrator, Civil Service (Medical): No    Lack of Transportation (Non-Medical): No  Physical Activity: Inactive (01/11/2023)   Exercise Vital Sign    Days of Exercise per Week: 0 days    Minutes of Exercise per Session: 0 min  Stress: No Stress Concern Present (01/11/2023)   [employer] of Occupational  Health - Occupational Stress Questionnaire    Feeling of Stress : Not at all  Social Connections: Socially Integrated (01/11/2023)   Social Connection and Isolation Panel [NHANES]    Frequency of Communication with Friends and Family: More than three times a week    Frequency of Social Gatherings with Friends and Family: Once a week    Attends Religious Services: More than 4 times per year    Active Member of [employer] or Organizations: Yes    Attends Banker Meetings: More than 4 times per year    Marital Status: Married  Catering manager Violence: Not At Risk (01/11/2023)   Humiliation, Afraid, Rape, and Kick questionnaire    Fear of Current or Ex-Partner: No    Emotionally Abused: No    Physically Abused: No    Sexually Abused: No    Past Surgical History:  Procedure Laterality Date   LEG SURGERY     RIGHT   QUADRICEPS TENDON REPAIR Left 12/17/2012   Procedure: REPAIR QUADRICEP TENDON;  Surgeon: Eldred Manges, MD;  Location: [facility] OR;  Service: Orthopedics;  Laterality: Left;  Left Quad Tendon Repair    Family History  Problem Relation Age of Onset   Hypertension Mother     No Known Allergies  Current Outpatient Medications on File Prior to Visit  Medication Sig Dispense Refill   amLODipine (NORVASC) 10 MG tablet Take 1 tablet (10 mg total) by mouth daily. 90 tablet 3   atorvastatin (LIPITOR) 10 MG tablet Take 1 tablet (10 mg total) by mouth daily. 30 tablet 11   colchicine 0.6 MG tablet Take 1 tablet (0.6 mg total) by mouth daily. 60 tablet 3   diclofenac (VOLTAREN) 75 MG EC tablet Take 1 tablet (75 mg total) by mouth 2 (two) times daily. 20 tablet 0   losartan (COZAAR) 50 MG tablet Take 1 tablet (50 mg total) by mouth daily. 90 tablet 3   sildenafil (VIAGRA) 100 MG tablet Take 1 tablet (100 mg total) by mouth daily as needed for erectile dysfunction. 10 tablet 0   traMADol (ULTRAM) 50 MG tablet Take 1 tablet (50 mg total) by mouth every 6 (six) hours as needed. 12 tablet  0   No current facility-administered medications on file prior to visit.    BP 132/78   Pulse 85   Temp 98 F (36.7 C)   Resp 18   Ht 6' (1.829 m)   Wt 203 lb (92.1 kg)   SpO2 98%   BMI 27.53 kg/m         Objective:   Physical Exam  General Mental Status- Alert. General Appearance- Not in acute distress.   Skin General: Color- Normal Color. Moisture- Normal Moisture.  Neck No JVD.  Chest and Lung Exam Auscultation: Breath Sounds:-CTA  Cardiovascular Auscultation:Rythm- RRR Murmurs & Other Heart Sounds:Auscultation of the heart reveals- No Murmurs.  Abdomen Inspection:-Inspeection Normal. Palpation/Percussion:Note:No mass. Palpation and Percussion of the abdomen reveal- Non Tender, Non Distended + BS, no rebound or guarding.  Neurologic Cranial Nerve exam:- CN III-XII intact(No nystagmus), symmetric smile. tric strength both upper and lower extremities.      Assessment & Plan:   Assessment and Plan    Hypertension Blood pressure improved to 132/78 mmHg with current regimen. Advised monitoring and symptom reporting. - Continue amlodipine 10 mg daily. - Continue losartan 50 mg daily. - Advise to monitor blood pressure 2-3 times per week. - Recheck blood pressure in office.  Hyperlipidemia Managed with atorvastatin. Fasting for cholesterol check. - Continue atorvastatin. - Order cholesterol panel.  Gout Uric acid normal six months ago, no recent flares. Continues colchicine preventatively. - Continue colchicine. - Order uric acid level. - Check kidney function.  Prediabetes Elevated blood sugar levels warrant A1c test. - Order A1c test.  General Health Maintenance Tdap and Shingrix vaccines due. Discussed Shingrix benefits. Prefers to delay Shingrix due to insurance. - Recommend Tdap vaccine through pharmacy. - Recommend Shingrix vaccine through pharmacy.  Follow-up Follow-up based on lab review. Informed about extended lab hours. -  Schedule follow-up appointment based on lab results.        Esperanza Richters, PA-C

## 2023-12-30 NOTE — Patient Instructions (Signed)
 Hypertension Blood pressure improved to 132/78 mmHg with current regimen. Advised monitoring and symptom reporting. - Continue amlodipine 10 mg daily. - Continue losartan 50 mg daily. - Advise to monitor blood pressure 2-3 times per week. - Recheck blood pressure in office.  Hyperlipidemia Managed with atorvastatin. Fasting for cholesterol check. - Continue atorvastatin. - Order cholesterol panel.  Gout Uric acid normal six months ago, no recent flares. Continues colchicine preventatively. - Continue colchicine. - Order uric acid level. - Check kidney function.  Prediabetes Elevated blood sugar levels warrant A1c test. - Order A1c test.  General Health Maintenance Tdap and Shingrix vaccines due. Discussed Shingrix benefits. Prefers to delay Shingrix due to insurance. - Recommend Tdap vaccine through pharmacy. - Recommend Shingrix vaccine through pharmacy.  Follow-up Follow-up based on lab review. Informed about extended lab hours. - Schedule follow-up appointment based on lab results.

## 2024-01-03 ENCOUNTER — Other Ambulatory Visit (HOSPITAL_COMMUNITY): Payer: Self-pay

## 2024-01-03 ENCOUNTER — Other Ambulatory Visit: Payer: Self-pay | Admitting: Medical

## 2024-01-03 MED ORDER — AMLODIPINE BESYLATE 10 MG PO TABS
10.0000 mg | ORAL_TABLET | Freq: Every day | ORAL | 3 refills | Status: DC
  Filled 2024-01-03: qty 90, 90d supply, fill #0

## 2024-01-21 ENCOUNTER — Ambulatory Visit: Payer: Medicare Other

## 2024-01-21 VITALS — Ht 73.5 in | Wt 203.0 lb

## 2024-01-21 DIAGNOSIS — Z0000 Encounter for general adult medical examination without abnormal findings: Secondary | ICD-10-CM

## 2024-01-21 NOTE — Progress Notes (Signed)
 Subjective:   [name] Frank is a 68 y.o. who presents for a Medicare Wellness preventive visit.  Visit Complete: Virtual I connected with  [place] on 01/21/24 by a audio enabled telemedicine application and verified that I am speaking with the correct person using two identifiers.  Patient Location: Home  Provider Location: Home Office  I discussed the limitations of evaluation and management by telemedicine. The patient expressed understanding and agreed to proceed.  Vital Signs: Because this visit was a virtual/telehealth visit, some criteria may be missing or patient reported. Any vitals not documented were not able to be obtained and vitals that have been documented are patient reported.    Persons Participating in Visit: Patient.  AWV Questionnaire: No: Patient Medicare AWV questionnaire was not completed prior to this visit.  Cardiac Risk Factors include: advanced age (>21men, >3 women);male gender;dyslipidemia;hypertension     Objective:    Today's Vitals   01/21/24 1442  Weight: 203 lb (92.1 kg)  Height: 6' 1.5" (1.867 m)   Body mass index is 26.42 kg/m.     01/21/2024    2:48 PM 01/11/2023    3:42 PM 12/18/2012    1:00 AM 12/16/2012    1:15 PM  Advanced Directives  Does Patient Have a Medical Advance Directive? No No Patient does not have advance directive Patient does not have advance directive  Would patient like information on creating a medical advance directive? No - Patient declined No - Patient declined      Current Medications (verified) Outpatient Encounter Medications as of 01/21/2024  Medication Sig   amLODipine (NORVASC) 10 MG tablet Take 1 tablet (10 mg total) by mouth daily.   atorvastatin (LIPITOR) 10 MG tablet Take 1 tablet (10 mg total) by mouth daily.   colchicine 0.6 MG tablet Take 1 tablet (0.6 mg total) by mouth daily.   diclofenac (VOLTAREN) 75 MG EC tablet Take 1 tablet (75 mg total) by mouth 2 (two) times daily.   losartan  (COZAAR) 50 MG tablet Take 1 tablet (50 mg total) by mouth daily.   sildenafil (VIAGRA) 100 MG tablet Take 1 tablet (100 mg total) by mouth daily as needed for erectile dysfunction.   traMADol (ULTRAM) 50 MG tablet Take 1 tablet (50 mg total) by mouth every 6 (six) hours as needed.   No facility-administered encounter medications on file as of 01/21/2024.    Allergies (verified) Patient has no known allergies.   History: Past Medical History:  Diagnosis Date   ACE-inhibitor cough    Fracture of leg 10/08/1986   right leg   Gout    Hypertension    Low back pain    Past Surgical History:  Procedure Laterality Date   LEG SURGERY     RIGHT   QUADRICEPS TENDON REPAIR Left 12/17/2012   Procedure: REPAIR QUADRICEP TENDON;  Surgeon: Eldred Manges, MD;  Location: [facility] OR;  Service: Orthopedics;  Laterality: Left;  Left Quad Tendon Repair   Family History  Problem Relation Age of Onset   Hypertension Mother    Social History   Socioeconomic History   Marital status: Married    Spouse name: Not on file   Number of children: Not on file   Years of education: Not on file   Highest education level: Not on file  Occupational History   Not on file  Tobacco Use   Smoking status: Never   Smokeless tobacco: Never  Substance and Sexual Activity   Alcohol use: No  Drug use: No   Sexual activity: Not on file  Other Topics Concern   Not on file  Social History Narrative   Occupation:  Naval architect  Museum/gallery conservator)   Married  3 grown children   Never Smoked   Alcohol use-no   Drug use-no       Smoking Status:  quit   Caffeine use/day:  NONE   Does Patient Exercise:  yes   Social Drivers of Corporate investment banker Strain: Low Risk  (01/21/2024)   Overall Financial Resource Strain (CARDIA)    Difficulty of Paying Living Expenses: Not hard at all  Food Insecurity: No Food Insecurity (01/21/2024)   Hunger Vital Sign    Worried About Running Out of Food in the Last Year: Never true     Ran Out of Food in the Last Year: Never true  Transportation Needs: No Transportation Needs (01/21/2024)   PRAPARE - Administrator, Civil Service (Medical): No    Lack of Transportation (Non-Medical): No  Physical Activity: Inactive (01/21/2024)   Exercise Vital Sign    Days of Exercise per Week: 0 days    Minutes of Exercise per Session: 0 min  Stress: No Stress Concern Present (01/21/2024)   [employer] of Occupational Health - Occupational Stress Questionnaire    Feeling of Stress : Not at all  Social Connections: Socially Integrated (01/21/2024)   Social Connection and Isolation Panel [NHANES]    Frequency of Communication with Friends and Family: More than three times a week    Frequency of Social Gatherings with Friends and Family: More than three times a week    Attends Religious Services: More than 4 times per year    Active Member of [employer] or Organizations: Yes    Attends Engineer, structural: More than 4 times per year    Marital Status: Married    Tobacco Counseling Counseling given: Not Answered    Clinical Intake:  Pre-visit preparation completed: Yes  Pain : No/denies pain     BMI - recorded: 26.42 Nutritional Status: BMI 25 -29 Overweight Nutritional Risks: None Diabetes: No  Lab Results  Component Value Date   HGBA1C 5.3 12/30/2023     How often do you need to have someone help you when you read instructions, pamphlets, or other written materials from your doctor or pharmacy?: 1 - Never  Interpreter Needed?: No  Information entered by :: Theresa Mulligan LPN   Activities of Daily Living     01/21/2024    2:47 PM  In your present state of health, do you have any difficulty performing the following activities:  Hearing? 0  Vision? 0  Difficulty concentrating or making decisions? 0  Walking or climbing stairs? 0  Dressing or bathing? 0  Doing errands, shopping? 0  Preparing Food and eating ? N  Using the Toilet? N   In the past six months, have you accidently leaked urine? N  Do you have problems with loss of bowel control? N  Managing your Medications? N  Managing your Finances? N  Housekeeping or managing your Housekeeping? N    Patient Care Team: Saguier, Kateri Mc as PCP - General (Internal Medicine)  Indicate any recent Medical Services you may have received from other than [facility] providers in the past year (date may be approximate).     Assessment:   This is a routine wellness examination for Teo.  Hearing/Vision screen Hearing Screening - Comments:: Denies hearing difficulties  Vision Screening - Comments:: Wears reading glasses - Not up to date with routine eye exams. You have been referred to [facility] for a complete eye exam. If you haven't heard from them in a few days, please call them to schedule your appointment.  [facility] [address] [place] [address] Phone: [phone]       Goals Addressed               This Visit's Progress     Increase physical activity (pt-stated)         Depression Screen     01/21/2024    2:47 PM 01/11/2023    3:46 PM 09/27/2017    3:47 PM  PHQ 2/9 Scores  PHQ - 2 Score 0 0 0    Fall Risk     01/21/2024    2:48 PM 01/11/2023    3:43 PM 06/08/2022   11:40 AM  Fall Risk   Falls in the past year? 0 0 0  Number falls in past yr: 0 0 0  Injury with Fall? 0 0 0  Risk for fall due to : No Fall Risks No Fall Risks   Follow up Falls prevention discussed;Falls evaluation completed Falls evaluation completed Falls evaluation completed    MEDICARE RISK AT HOME:  Medicare Risk at Home Any stairs in or around the home?: Yes If so, are there any without handrails?: No Home free of loose throw rugs in walkways, pet beds, electrical cords, etc?: Yes Adequate lighting in your home to reduce risk of falls?: Yes Life alert?: No Use of a cane, walker or w/c?: No Grab bars in the bathroom?: Yes Shower chair or bench in  shower?: No Elevated toilet seat or a handicapped toilet?: No  TIMED UP AND GO:  Was the test performed?  No  Cognitive Function: 6CIT completed    01/11/2023    4:10 PM  MMSE - Mini Mental State Exam  Not completed: Unable to complete        01/21/2024    2:48 PM  6CIT Screen  What Year? 0 points  What month? 0 points  What time? 0 points  Count back from 20 0 points  Months in reverse 0 points  Repeat phrase 0 points  Total Score 0 points    Immunizations Immunization History  Administered Date(s) Administered   Fluad Trivalent(High Dose 65+) 07/01/2023   Influenza Whole 08/25/2007   Influenza,inj,Quad PF,6+ Mos 06/08/2022   PNEUMOCOCCAL CONJUGATE-20 06/08/2022   Td 06/02/2010    Screening Tests Health Maintenance  Topic Date Due   Zoster Vaccines- Shingrix (1 of 2) Never done   DTaP/Tdap/Td (2 - Tdap) 06/02/2020   COVID-19 Vaccine (1 - 2024-25 season) Never done   Hepatitis C Screening  01/18/2027 (Originally 12/02/1973)   INFLUENZA VACCINE  05/08/2024   Medicare Annual Wellness (AWV)  01/20/2025   Colonoscopy  03/31/2033   Pneumonia Vaccine 53+ Years old  Completed   HPV VACCINES  Aged Out   Meningococcal B Vaccine  Aged Out    Health Maintenance  Health Maintenance Due  Topic Date Due   Zoster Vaccines- Shingrix (1 of 2) Never done   DTaP/Tdap/Td (2 - Tdap) 06/02/2020   COVID-19 Vaccine (1 - 2024-25 season) Never done   Health Maintenance Items Addressed: Vaccines Deferred  Additional Screening:  Vision Screening: Recommended annual ophthalmology exams for early detection of glaucoma and other disorders of the eye.  Dental Screening: Recommended  annual dental exams for proper oral hygiene  Community Resource Referral / Chronic Care Management: CRR required this visit?  No   CCM required this visit?  No     Plan:     I have personally reviewed and noted the following in the patient's chart:   Medical and social history Use of alcohol,  tobacco or illicit drugs  Current medications and supplements including opioid prescriptions. Patient is currently taking opioid prescriptions. Information provided to patient regarding non-opioid alternatives. Patient advised to discuss non-opioid treatment plan with their provider. Functional ability and status Nutritional status Physical activity Advanced directives List of other physicians Hospitalizations, surgeries, and ER visits in previous 12 months Vitals Screenings to include cognitive, depression, and falls Referrals and appointments  In addition, I have reviewed and discussed with patient certain preventive protocols, quality metrics, and best practice recommendations. A written personalized care plan for preventive services as well as general preventive health recommendations were provided to patient.     Dewayne Ford, LPN   [id number]   After Visit Summary: (MyChart) Due to this being a telephonic visit, the after visit summary with patients personalized plan was offered to patient via MyChart   Notes: Nothing significant to report at this time.

## 2024-01-21 NOTE — Patient Instructions (Addendum)
 [name] , Thank you for taking time to come for your Medicare Wellness Visit. I appreciate your ongoing commitment to your health goals. Please review the following plan we discussed and let me know if I can assist you in the future.   Referrals/Orders/Follow-Ups/Clinician Recommendations:   This is a list of the screening recommended for you and due dates:  Health Maintenance  Topic Date Due   Zoster (Shingles) Vaccine (1 of 2) Never done   DTaP/Tdap/Td vaccine (2 - Tdap) 06/02/2020   COVID-19 Vaccine (1 - 2024-25 season) Never done   Hepatitis C Screening  01/18/2027*   Flu Shot  05/08/2024   Medicare Annual Wellness Visit  01/20/2025   Colon Cancer Screening  03/31/2033   Pneumonia Vaccine  Completed   HPV Vaccine  Aged Out   Meningitis B Vaccine  Aged Out  *Topic was postponed. The date shown is not the original due date.  Opioid Pain Medicine Management Opioids are powerful medicines that are used to treat moderate to severe pain. When used for short periods of time, they can help you to: Sleep better. Do better in physical or occupational therapy. Feel better in the first few days after an injury. Recover from surgery. Opioids should be taken with the supervision of a trained health care provider. They should be taken for the shortest period of time possible. This is because opioids can be addictive, and the longer you take opioids, the greater your risk of addiction. This addiction can also be called opioid use disorder. What are the risks? Using opioid pain medicines for longer than 3 days increases your risk of side effects. Side effects include: Constipation. Nausea and vomiting. Breathing difficulties (respiratory depression). Drowsiness. Confusion. Opioid use disorder. Itching. Taking opioid pain medicine for a long period of time can affect your ability to do daily tasks. It also puts you at risk for: Motor vehicle crashes. Depression. Suicide. Heart  attack. Overdose, which can be life-threatening. What is a pain treatment plan? A pain treatment plan is an agreement between you and your health care provider. Pain is unique to each person, and treatments vary depending on your condition. To manage your pain, you and your health care provider need to work together. To help you do this: Discuss the goals of your treatment, including how much pain you might expect to have and how you will manage the pain. Review the risks and benefits of taking opioid medicines. Remember that a good treatment plan uses more than one approach and minimizes the chance of side effects. Be honest about the amount of medicines you take and about any drug or alcohol use. Get pain medicine prescriptions from only one health care provider. Pain can be managed with many types of alternative treatments. Ask your health care provider to refer you to one or more specialists who can help you manage pain through: Physical or occupational therapy. Counseling (cognitive behavioral therapy). Good nutrition. Biofeedback. Massage. Meditation. Non-opioid medicine. Following a gentle exercise program. How to use opioid pain medicine Taking medicine Take your pain medicine exactly as told by your health care provider. Take it only when you need it. If your pain gets less severe, you may take less than your prescribed dose if your health care provider approves. If you are not having pain, do nottake pain medicine unless your health care provider tells you to take it. If your pain is severe, do nottry to treat it yourself by taking more pills than instructed on your  prescription. Contact your health care provider for help. Write down the times when you take your pain medicine. It is easy to become confused while on pain medicine. Writing the time can help you avoid overdose. Take other over-the-counter or prescription medicines only as told by your health care provider. Keeping  yourself and others safe  While you are taking opioid pain medicine: Do not drive, use machinery, or power tools. Do not sign legal documents. Do not drink alcohol. Do not take sleeping pills. Do not supervise children by yourself. Do not do activities that require climbing or being in high places. Do not go to a lake, river, ocean, spa, or swimming pool. Do not share your pain medicine with anyone. Keep pain medicine in a locked cabinet or in a secure area where pets and children cannot reach it. Stopping your use of opioids If you have been taking opioid medicine for more than a few weeks, you may need to slowly decrease (taper) how much you take until you stop completely. Tapering your use of opioids can decrease your risk of symptoms of withdrawal, such as: Pain and cramping in the abdomen. Nausea. Sweating. Sleepiness. Restlessness. Uncontrollable shaking (tremors). Cravings for the medicine. Do not attempt to taper your use of opioids on your own. Talk with your health care provider about how to do this. Your health care provider may prescribe a step-down schedule based on how much medicine you are taking and how long you have been taking it. Getting rid of leftover pills Do not save any leftover pills. Get rid of leftover pills safely by: Taking the medicine to a prescription take-back program. This is usually offered by the county or law enforcement. Bringing them to a pharmacy that has a drug disposal container. Flushing them down the toilet. Check the label or package insert of your medicine to see whether this is safe to do. Throwing them out in the trash. Check the label or package insert of your medicine to see whether this is safe to do. If it is safe to throw it out, remove the medicine from the original container, put it into a sealable bag or container, and mix it with used coffee grounds, food scraps, dirt, or cat litter before putting it in the trash. Follow these  instructions at home: Activity Do exercises as told by your health care provider. Avoid activities that make your pain worse. Return to your normal activities as told by your health care provider. Ask your health care provider what activities are safe for you. General instructions You may need to take these actions to prevent or treat constipation: Drink enough fluid to keep your urine pale yellow. Take over-the-counter or prescription medicines. Eat foods that are high in fiber, such as beans, whole grains, and fresh fruits and vegetables. Limit foods that are high in fat and processed sugars, such as fried or sweet foods. Keep all follow-up visits. This is important. Where to find support If you have been taking opioids for a long time, you may benefit from receiving support for quitting from a local support group or counselor. Ask your health care provider for a referral to these resources in your area. Where to find more information Centers for Disease Control and Prevention (CDC): FootballExhibition.com.br [place] Food and Drug Administration (FDA): PumpkinSearch.com.ee Get help right away if: You may have taken too much of an opioid (overdosed). Common symptoms of an overdose: Your breathing is slower or more shallow than normal. You have a very  slow heartbeat (pulse). You have slurred speech. You have nausea and vomiting. Your pupils become very small. You have other potential symptoms: You are very confused. You faint or feel like you will faint. You have cold, clammy skin. You have blue lips or fingernails. You have thoughts of harming yourself or harming others. These symptoms may represent a serious problem that is an emergency. Do not wait to see if the symptoms will go away. Get medical help right away. Call your local emergency services (911 in the [place] Do not drive yourself to the hospital.  If you ever feel like you may hurt yourself or others, or have thoughts about taking your own life, get  help right away. Go to your nearest emergency department or: Call your local emergency services (911 in the [place] Call the [facility] (7037439452 in the [place] Call a suicide crisis helpline, such as the National Suicide Prevention Lifeline at 301-487-3126 or 988 in the [place] This is open 24 hours a day in the [place] If you're a Veteran: Call 988 and press 1. This is open 24 hours a day. Text the [employer] at 938 136 8939. Summary Opioid medicines can help you manage moderate to severe pain for a short period of time. A pain treatment plan is an agreement between you and your health care provider. Discuss the goals of your treatment, including how much pain you might expect to have and how you will manage the pain. If you think that you or someone else may have taken too much of an opioid, get medical help right away. This information is not intended to replace advice given to you by your health care provider. Make sure you discuss any questions you have with your health care provider. Document Revised: 07/01/2023 Document Reviewed: 01/04/2021 Elsevier Patient Education  2024 Elsevier Inc.  Advanced directives: (Declined) Advance directive discussed with you today. Even though you declined this today, please call our office should you change your mind, and we can give you the proper paperwork for you to fill out.  Next Medicare Annual Wellness Visit scheduled for next year: Yes

## 2024-02-06 ENCOUNTER — Other Ambulatory Visit (HOSPITAL_COMMUNITY): Payer: Self-pay

## 2024-02-06 ENCOUNTER — Other Ambulatory Visit: Payer: Self-pay | Admitting: Medical

## 2024-02-06 NOTE — Telephone Encounter (Signed)
 Copied from CRM [id number]. Topic: Clinical - Medication Refill >> Feb 06, 2024  4:33 PM Chuck Crater wrote: Most Recent Primary Care Visit:  Provider: Dewayne Ford  Department: [facility]  Visit Type: MEDICARE AWV, SEQUENTIAL  Date: 01/21/2024  Medication: colchicine  0.6 MG tablet, amLODipine  (NORVASC ) 10 MG tablet , losartan  (COZAAR ) 50 MG tablet  *Please call in enough refills for patient, stated that it was not called in after last visit   Has the patient contacted their pharmacy? No (Agent: If no, request that the patient contact the pharmacy for the refill. If patient does not wish to contact the pharmacy document the reason why and proceed with request.) (Agent: If yes, when and what did the pharmacy advise?) no refills   Is this the correct pharmacy for this prescription? Yes If no, delete pharmacy and type the correct one.  This is the patient's preferred pharmacy:  [facility] [address] [place] [address] Phone: [phone] Fax: [phone]   Has the prescription been filled recently? yes  Is the patient out of the medication? Yes  Has the patient been seen for an appointment in the last year OR does the patient have an upcoming appointment? Yes  Can we respond through MyChart? No  Agent: Please be advised that Rx refills may take up to 3 business days. We ask that you follow-up with your pharmacy.

## 2024-02-07 ENCOUNTER — Other Ambulatory Visit (HOSPITAL_COMMUNITY): Payer: Self-pay

## 2024-02-07 ENCOUNTER — Other Ambulatory Visit: Payer: Self-pay

## 2024-02-07 MED ORDER — LOSARTAN POTASSIUM 50 MG PO TABS
50.0000 mg | ORAL_TABLET | Freq: Every day | ORAL | 3 refills | Status: DC
  Filled 2024-02-07 – 2024-03-20 (×2): qty 90, 90d supply, fill #0

## 2024-02-07 MED ORDER — COLCHICINE 0.6 MG PO TABS
0.6000 mg | ORAL_TABLET | Freq: Every day | ORAL | 3 refills | Status: DC
Start: 2024-02-07 — End: 2024-07-06
  Filled 2024-02-07: qty 60, 60d supply, fill #0
  Filled 2024-04-30: qty 60, 60d supply, fill #1

## 2024-02-07 MED ORDER — AMLODIPINE BESYLATE 10 MG PO TABS
10.0000 mg | ORAL_TABLET | Freq: Every day | ORAL | 3 refills | Status: DC
Start: 2024-02-07 — End: 2024-07-06
  Filled 2024-02-07 – 2024-07-01 (×2): qty 90, 90d supply, fill #0

## 2024-02-10 ENCOUNTER — Other Ambulatory Visit (HOSPITAL_COMMUNITY): Payer: Self-pay

## 2024-03-20 ENCOUNTER — Other Ambulatory Visit (HOSPITAL_COMMUNITY): Payer: Self-pay

## 2024-04-30 ENCOUNTER — Other Ambulatory Visit (HOSPITAL_COMMUNITY): Payer: Self-pay

## 2024-07-01 ENCOUNTER — Other Ambulatory Visit (HOSPITAL_COMMUNITY): Payer: Self-pay

## 2024-07-06 ENCOUNTER — Encounter: Payer: Self-pay | Admitting: Medical

## 2024-07-06 ENCOUNTER — Ambulatory Visit: Payer: Self-pay | Admitting: Medical

## 2024-07-06 ENCOUNTER — Ambulatory Visit (INDEPENDENT_AMBULATORY_CARE_PROVIDER_SITE_OTHER): Payer: Medicare (Managed Care) | Admitting: Medical

## 2024-07-06 ENCOUNTER — Other Ambulatory Visit (HOSPITAL_COMMUNITY): Payer: Self-pay

## 2024-07-06 VITALS — BP 132/75 | HR 78 | Temp 98.3°F | Resp 15 | Ht 72.0 in | Wt 199.0 lb

## 2024-07-06 DIAGNOSIS — N529 Male erectile dysfunction, unspecified: Secondary | ICD-10-CM

## 2024-07-06 DIAGNOSIS — R350 Frequency of micturition: Secondary | ICD-10-CM

## 2024-07-06 DIAGNOSIS — Z23 Encounter for immunization: Secondary | ICD-10-CM | POA: Diagnosis not present

## 2024-07-06 DIAGNOSIS — H6123 Impacted cerumen, bilateral: Secondary | ICD-10-CM

## 2024-07-06 DIAGNOSIS — M1000 Idiopathic gout, unspecified site: Secondary | ICD-10-CM | POA: Diagnosis not present

## 2024-07-06 DIAGNOSIS — E785 Hyperlipidemia, unspecified: Secondary | ICD-10-CM | POA: Diagnosis not present

## 2024-07-06 DIAGNOSIS — I10 Essential (primary) hypertension: Secondary | ICD-10-CM

## 2024-07-06 LAB — COMPLETE METABOLIC PANEL WITHOUT GFR
AG Ratio: 1.4 (calc) (ref 1.0–2.5)
ALT: 13 U/L (ref 9–46)
AST: 14 U/L (ref 10–35)
Albumin: 4.3 g/dL (ref 3.6–5.1)
Alkaline phosphatase (APISO): 75 U/L (ref 35–144)
BUN: 11 mg/dL (ref 7–25)
CO2: 29 mmol/L (ref 20–32)
Calcium: 9.2 mg/dL (ref 8.6–10.3)
Chloride: 105 mmol/L (ref 98–110)
Creat: 1.16 mg/dL (ref 0.70–1.35)
Globulin: 3 g/dL (ref 1.9–3.7)
Glucose, Bld: 91 mg/dL (ref 65–99)
Potassium: 3.8 mmol/L (ref 3.5–5.3)
Sodium: 139 mmol/L (ref 135–146)
Total Bilirubin: 0.6 mg/dL (ref 0.2–1.2)
Total Protein: 7.3 g/dL (ref 6.1–8.1)

## 2024-07-06 LAB — LIPID PANEL
Cholesterol: 134 mg/dL (ref 0–200)
HDL: 43.9 mg/dL (ref >39.00)
LDL Cholesterol: 67 mg/dL (ref 0–99)
NonHDL: 89.75
Total CHOL/HDL Ratio: 3
Triglycerides: 114 mg/dL (ref 0.0–149.0)
VLDL: 22.8 mg/dL (ref 0.0–40.0)

## 2024-07-06 LAB — PSA: PSA: 0.21 ng/mL (ref 0.10–4.00)

## 2024-07-06 LAB — URIC ACID: Uric Acid, Serum: 5.8 mg/dL (ref 4.0–7.8)

## 2024-07-06 MED ORDER — AMLODIPINE BESYLATE 10 MG PO TABS
10.0000 mg | ORAL_TABLET | Freq: Every day | ORAL | 3 refills | Status: AC
  Filled 2024-07-06 – 2024-11-06 (×2): qty 90, 90d supply, fill #0

## 2024-07-06 MED ORDER — COLCHICINE 0.6 MG PO TABS
0.6000 mg | ORAL_TABLET | Freq: Every day | ORAL | 3 refills | Status: DC
  Filled 2024-07-06 – 2024-08-14 (×2): qty 60, 60d supply, fill #0

## 2024-07-06 MED ORDER — LOSARTAN POTASSIUM 50 MG PO TABS
50.0000 mg | ORAL_TABLET | Freq: Every day | ORAL | 3 refills | Status: AC
  Filled 2024-07-06 – 2024-09-16 (×2): qty 90, 90d supply, fill #0

## 2024-07-06 NOTE — Progress Notes (Signed)
 Subjective:    Patient ID: [name] Frank, [name]    DOB: 1956-02-10, 68 y.o.   MRN: [id number]  HPI In for follow up. Last visit AVS below in    Hypertension Blood pressure improved to 132/78 mmHg with current regimen. Advised monitoring and symptom reporting. - Continue amlodipine  10 mg daily. - Continue losartan  50 mg daily. - Advise to monitor blood pressure 2-3 times per week. - Recheck blood pressure in office.   Hyperlipidemia Managed with atorvastatin . Fasting for cholesterol check. - Continue atorvastatin . - Order cholesterol panel.   Gout Uric acid normal six months ago, no recent flares. Continues colchicine  preventatively. - Continue colchicine . - Order uric acid level. - Check kidney function.   Prediabetes Elevated blood sugar levels warrant A1c test. - Order A1c test.   General Health Maintenance Tdap and Shingrix vaccines due. Discussed Shingrix benefits. Prefers to delay Shingrix due to insurance. - Recommend Tdap vaccine through pharmacy. - Recommend Shingrix vaccine through pharmacy.   Discussed the use of AI scribe software for clinical note transcription with the patient, who gave verbal consent to proceed.  History of Present Illness   [name] Frank is a 68 year old [name] with hypertension and hyperlipidemia who presents for follow-up of his chronic conditions.  His blood pressure readings at home align with today's measurement of 140/84 mmHg. He is currently on amlodipine  10 mg and losartan  50 mg daily. Though second check better.  He manages gout with colchicine , taking it every other day, and has not experienced any recent flares. He uses the medication primarily for prevention.  He is on atorvastatin  10 mg daily for high cholesterol.  He experiences frequent urination, especially at night, which he attributes to high water intake. He keeps water by his bedside and drinks throughout the night.  He has tried various medications for erectile  dysfunction, including Viagra  and Cialis, but reports that Viagra  only caused headaches and did not improve his condition, even at the maximum dose of 100 mg.   End noted cerumen impaction. He can hear but can feel that has was.                 Review of Systems  Constitutional:  Negative for chills, fatigue and fever.  HENT:  Negative for congestion, ear discharge, ear pain, postnasal drip, sinus pain and tinnitus.   Respiratory:  Negative for cough, chest tightness and wheezing.   Cardiovascular:  Negative for chest pain and palpitations.  Gastrointestinal:  Negative for abdominal pain, blood in stool, diarrhea and vomiting.  Genitourinary:  Positive for frequency. Negative for dysuria, hematuria and testicular pain.       No perineium pain. No fever or chills. Drinks beverages at night. A lot of water.  Musculoskeletal:  Negative for back pain, myalgias and neck stiffness.  Skin:  Negative for rash.  Neurological:  Negative for dizziness, speech difficulty, weakness and light-headedness.  Hematological:  Negative for adenopathy.  Psychiatric/Behavioral:  Negative for behavioral problems and decreased concentration.        Past Medical History:  Diagnosis Date   ACE-inhibitor cough    Fracture of leg 10/08/1986   right leg   Gout    Hypertension    Low back pain      Social History   Socioeconomic History   Marital status: Married    Spouse name: Not on file   Number of children: Not on file   Years of education: Not on file   Highest education  level: Not on file  Occupational History   Not on file  Tobacco Use   Smoking status: Never   Smokeless tobacco: Never  Substance and Sexual Activity   Alcohol use: No   Drug use: No   Sexual activity: Not on file  Other Topics Concern   Not on file  Social History Narrative   Occupation:  Naval architect  Museum/gallery conservator)   Married  3 grown children   Never Smoked   Alcohol use-no   Drug use-no       Smoking Status:   quit   Caffeine use/day:  NONE   Does Patient Exercise:  yes   Social Drivers of Corporate investment banker Strain: Low Risk  (01/21/2024)   Overall Financial Resource Strain (CARDIA)    Difficulty of Paying Living Expenses: Not hard at all  Food Insecurity: No Food Insecurity (01/21/2024)   Hunger Vital Sign    Worried About Running Out of Food in the Last Year: Never true    Ran Out of Food in the Last Year: Never true  Transportation Needs: No Transportation Needs (01/21/2024)   PRAPARE - Administrator, Civil Service (Medical): No    Lack of Transportation (Non-Medical): No  Physical Activity: Inactive (01/21/2024)   Exercise Vital Sign    Days of Exercise per Week: 0 days    Minutes of Exercise per Session: 0 min  Stress: No Stress Concern Present (01/21/2024)   [employer] of Occupational Health - Occupational Stress Questionnaire    Feeling of Stress : Not at all  Social Connections: Socially Integrated (01/21/2024)   Social Connection and Isolation Panel    Frequency of Communication with Friends and Family: More than three times a week    Frequency of Social Gatherings with Friends and Family: More than three times a week    Attends Religious Services: More than 4 times per year    Active Member of [employer] or Organizations: Yes    Attends Banker Meetings: More than 4 times per year    Marital Status: Married  Catering manager Violence: Not At Risk (01/21/2024)   Humiliation, Afraid, Rape, and Kick questionnaire    Fear of Current or Ex-Partner: No    Emotionally Abused: No    Physically Abused: No    Sexually Abused: No    Past Surgical History:  Procedure Laterality Date   LEG SURGERY     RIGHT   QUADRICEPS TENDON REPAIR Left 12/17/2012   Procedure: REPAIR QUADRICEP TENDON;  Surgeon: Oneil JAYSON Herald, MD;  Location: [facility] OR;  Service: Orthopedics;  Laterality: Left;  Left Quad Tendon Repair    Family History  Problem Relation Age of Onset    Hypertension Mother     No Known Allergies  Current Outpatient Medications on File Prior to Visit  Medication Sig Dispense Refill   atorvastatin  (LIPITOR) 10 MG tablet Take 1 tablet (10 mg total) by mouth daily. 30 tablet 11   diclofenac  (VOLTAREN ) 75 MG EC tablet Take 1 tablet (75 mg total) by mouth 2 (two) times daily. 20 tablet 0   sildenafil  (VIAGRA ) 100 MG tablet Take 1 tablet (100 mg total) by mouth daily as needed for erectile dysfunction. 10 tablet 0   traMADol  (ULTRAM ) 50 MG tablet Take 1 tablet (50 mg total) by mouth every 6 (six) hours as needed. 12 tablet 0   No current facility-administered medications on file prior to visit.    BP 132/75  Pulse 78   Temp 98.3 F (36.8 C) (Oral)   Resp 15   Ht 6' (1.829 m)   Wt 199 lb (90.3 kg)   SpO2 98%   BMI 26.99 kg/m          Objective:   Physical Exam  General Mental Status- Alert. General Appearance- Not in acute distress.   Skin General: Color- Normal Color. Moisture- Normal Moisture.  Neck  No JVD.  Chest and Lung Exam Auscultation: Breath Sounds:-cta  Cardiovascular Auscultation:Rythm- RRR Murmurs & Other Heart Sounds:Auscultation of the heart reveals- No Murmurs.  Abdomen Inspection:-Inspeection Normal. Palpation/Percussion:Note:No mass. Palpation and Percussion of the abdomen reveal- Non Tender, Non Distended + BS, no rebound or guarding.   Neurologic Cranial Nerve exam:- CN III-XII intact(No nystagmus), symmetric smile. Strength:- 5/5 equal and symmetric strength both upper and lower extremities.    Heent- bilateral severe cerumen impaction. Can't see tm on either side. Post lavage same.    Assessment & Plan:   Patient Instructions  Hypertension Blood pressure borderline at 140/84 mmHg. On amlodipine  10 mg and losartan  50 mg. - Continue amlodipine  10 mg daily. - Continue losartan  50 mg daily.  Gout No recent flares. On colchicine  every other day for prevention. - Continue colchicine   every other day.  Hyperlipidemia On atorvastatin  10 mg daily. - Continue atorvastatin  10 mg daily.  Erectile dysfunction Viagra  and Cialis ineffective. Headache with Viagra . All medications in class have similar action. - Consider retrying erectile dysfunction medication upon patient's request.  Frequent urination Experiencing frequent urination, especially nocturia. Possible benign prostatic hypertrophy. - Check PSA levels. - Advise to stop drinking water after 7 PM.   Cerumen impaction initially advised try debrox for 4 days and then lavage ears next week but he prefers lavage today due to work and having to take time off for visits. -pt prefers to try to wash out ears today. -procedure explained and verbal consent given.  -unfortunately failed attempt to lavage. No wax came out so can schedule next week after lavage or I can refer to ENT.  Cmp, lipid panel, psa and uric acid   Follow up next Monday or Tuesday or sooner if needed   [employer], PA-C

## 2024-07-06 NOTE — Patient Instructions (Addendum)
 Hypertension Blood pressure borderline at 140/84 mmHg. On amlodipine  10 mg and losartan  50 mg. - Continue amlodipine  10 mg daily. - Continue losartan  50 mg daily.  Gout No recent flares. On colchicine  every other day for prevention. - Continue colchicine  every other day.  Hyperlipidemia On atorvastatin  10 mg daily. - Continue atorvastatin  10 mg daily.  Erectile dysfunction Viagra  and Cialis ineffective. Headache with Viagra . All medications in class have similar action. - Consider retrying erectile dysfunction medication upon patient's request.  Frequent urination Experiencing frequent urination, especially nocturia. Possible benign prostatic hypertrophy. - Check PSA levels. - Advise to stop drinking water after 7 PM.   Cerumen impaction initially advised try debrox for 4 days and then lavage ears next week but he prefers lavage today due to work and having to take time off for visits. -pt prefers to try to wash out ears today. -procedure explained and verbal consent given.  -unfortunately failed attempt to lavage. No wax came out so can schedule next week after lavage or I can refer to ENT.  Cmp, lipid panel, psa and uric acid   Follow up next Monday or Tuesday or sooner if needed

## 2024-07-15 ENCOUNTER — Other Ambulatory Visit (HOSPITAL_COMMUNITY): Payer: Self-pay

## 2024-08-14 ENCOUNTER — Other Ambulatory Visit (HOSPITAL_COMMUNITY): Payer: Self-pay

## 2024-09-16 ENCOUNTER — Other Ambulatory Visit (HOSPITAL_COMMUNITY): Payer: Self-pay

## 2024-09-25 ENCOUNTER — Other Ambulatory Visit: Payer: Self-pay

## 2024-09-25 ENCOUNTER — Other Ambulatory Visit (HOSPITAL_COMMUNITY): Payer: Self-pay

## 2024-09-25 ENCOUNTER — Other Ambulatory Visit: Payer: Self-pay | Admitting: Medical

## 2024-09-25 ENCOUNTER — Telehealth: Payer: Self-pay

## 2024-09-25 MED ORDER — COLCHICINE 0.6 MG PO TABS
0.6000 mg | ORAL_TABLET | Freq: Every day | ORAL | 3 refills | Status: AC
  Filled 2024-09-25 – 2024-10-02 (×2): qty 60, 60d supply, fill #0

## 2024-09-25 NOTE — Telephone Encounter (Unsigned)
 Copied from CRM #[id number]. Topic: Clinical - Medication Refill >> Sep 25, 2024  7:51 AM Franky GRADE wrote: Medication: colchicine  0.6 MG tablet  Has the patient contacted their pharmacy? No (Agent: If no, request that the patient contact the pharmacy for the refill. If patient does not wish to contact the pharmacy document the reason why and proceed with request.) (Agent: If yes, when and what did the pharmacy advise?)  This is the patient's preferred pharmacy:  [facility] [address] [place] [address] Phone: [phone] Fax: [phone]  Is this the correct pharmacy for this prescription? Yes If no, delete pharmacy and type the correct one.   Has the prescription been filled recently? No  Is the patient out of the medication? Yes  Has the patient been seen for an appointment in the last year OR does the patient have an upcoming appointment? Yes  Can we respond through MyChart? No  Agent: Please be advised that Rx refills may take up to 3 business days. We ask that you follow-up with your pharmacy.

## 2024-09-25 NOTE — Telephone Encounter (Unsigned)
 Copied from CRM #[id number]. Topic: Clinical - Red Word Triage >> Sep 25, 2024  8:44 AM Adelita E wrote: [facility] that prompted transfer to Nurse Triage: Joint pain, gout flare up in right hip and right knee. Patient needing colchicine  today.

## 2024-09-25 NOTE — Telephone Encounter (Signed)
Called pt refill sent

## 2024-10-02 ENCOUNTER — Other Ambulatory Visit (HOSPITAL_COMMUNITY): Payer: Self-pay

## 2024-11-06 ENCOUNTER — Other Ambulatory Visit (HOSPITAL_COMMUNITY): Payer: Self-pay

## 2025-01-04 ENCOUNTER — Ambulatory Visit: Admitting: Medical

## 2025-01-26 ENCOUNTER — Ambulatory Visit
